# Patient Record
Sex: Male | Born: 1970
Health system: Southern US, Community
[De-identification: ages and names within clinical notes are randomized; demographics above are authoritative.]

---

## 2009-02-21 ENCOUNTER — Emergency Department (HOSPITAL_COMMUNITY): Admission: EM | Admit: 2009-02-21 | Discharge: 2009-02-22 | Payer: Self-pay | Admitting: Emergency Medicine

## 2013-01-07 ENCOUNTER — Emergency Department (HOSPITAL_BASED_OUTPATIENT_CLINIC_OR_DEPARTMENT_OTHER)
Admission: EM | Admit: 2013-01-07 | Discharge: 2013-01-08 | Disposition: A | Discharge status: Home / Self Care | Payer: Self-pay | Attending: Emergency Medicine | Admitting: Emergency Medicine

## 2013-01-07 ENCOUNTER — Encounter (HOSPITAL_BASED_OUTPATIENT_CLINIC_OR_DEPARTMENT_OTHER): Payer: Self-pay | Admitting: Emergency Medicine

## 2013-01-07 DIAGNOSIS — X088XXA Exposure to other specified smoke, fire and flames, initial encounter: Secondary | ICD-10-CM | POA: Insufficient documentation

## 2013-01-07 DIAGNOSIS — T23021A Burn of unspecified degree of single right finger (nail) except thumb, initial encounter: Secondary | ICD-10-CM

## 2013-01-07 DIAGNOSIS — Z87891 Personal history of nicotine dependence: Secondary | ICD-10-CM | POA: Insufficient documentation

## 2013-01-07 DIAGNOSIS — Y93G9 Activity, other involving cooking and grilling: Secondary | ICD-10-CM | POA: Insufficient documentation

## 2013-01-07 DIAGNOSIS — T23129A Burn of first degree of unspecified single finger (nail) except thumb, initial encounter: Secondary | ICD-10-CM | POA: Insufficient documentation

## 2013-01-07 DIAGNOSIS — Y929 Unspecified place or not applicable: Secondary | ICD-10-CM | POA: Insufficient documentation

## 2013-01-07 MED ORDER — CEPHALEXIN 500 MG PO CAPS: 500.0000 mg | ORAL_CAPSULE | Freq: Four times a day (QID) | ORAL | Status: DC

## 2013-01-07 NOTE — ED Notes (Signed)
Pt states that he burnt the tip of his right index finger 2 months ago and area has not healed, pt reports that it continuously bleeds when it touches anything

## 2013-01-07 NOTE — ED Provider Notes (Signed)
CSN: [id number]     Arrival date & time 01/07/13  2227 History   First MD Initiated Contact with Patient 01/07/13 2301     Chief Complaint  Patient presents with  . Wound Check   (Consider location/radiation/quality/duration/timing/severity/associated sxs/prior Treatment) HPI Comments: 42 year old male comes in due to a poorly healing finger. Patient states that 2 months ago he was grilling and accidentally burned his right ring finger. Since then his become black and the tip and it bleeds whenever he touches something. He works as a Paediatric nurse and every time his hand contact something he says he knows the skin was often bleeds. He started noticing focal swelling to his fingertip and cut part of it with scissors last week. Denies any drainage. No fevers or chills. No numbness or tingling. Has no problems using his hand. He has not seen anyone for his finger initially or since the burn. He states that he had a tetanus update 2 years ago.     History reviewed. No pertinent past medical history. History reviewed. No pertinent past surgical history. History reviewed. No pertinent family history. History  Substance Use Topics  . Smoking status: Former Smoker    Types: Cigarettes    Quit date: 02/07/2012  . Smokeless tobacco: Not on file  . Alcohol Use: No    Review of Systems  Constitutional: Negative for fever and chills.  Musculoskeletal: Negative for joint swelling.  Skin: Positive for color change and wound.    Allergies  Review of patient's allergies indicates no known allergies.  Home Medications  No current outpatient prescriptions on file. BP 127/79  Pulse 66  Temp(Src) 98.5 F (36.9 C) (Oral)  Resp 18  Ht 5\' 7"  (1.702 m)  Wt 155 lb (70.308 kg)  BMI 24.27 kg/m2  SpO2 100% Physical Exam  Nursing note and vitals reviewed. Constitutional: He is oriented to person, place, and time. He appears well-developed and well-nourished. No distress.  HENT:  Head:  Normocephalic.  Eyes: Right eye exhibits no discharge. Left eye exhibits no discharge.  Cardiovascular: Intact distal pulses.   Pulmonary/Chest: Effort normal.  Abdominal: He exhibits no distension.  Musculoskeletal:       Hands: Neurological: He is alert and oriented to person, place, and time.  Skin: Skin is warm and dry.    ED Course  Procedures (including critical care time) Labs Review Labs Reviewed - No data to display Imaging Review No results found.  MDM   1. Burn of finger, right, unspecified degree, initial encounter    There is some mild erythema surrounding a circular black lesion at the tip of his finger is concerning for possible early infection. i'll cover with antibiotics, especially since he has been manipulating it with scissors. Patient feels like he might have a report is requesting a referral to a dermatologist. It is difficult to tell if there is a wart there due to the color of the skin and swelling. Do not feel any appreciable induration or abscess. Referred to the dermatologist and hand surgeon. Tetanus is up-to-date.   Audree Camel, MD 01/08/13 0005

## 2013-05-01 ENCOUNTER — Emergency Department (HOSPITAL_BASED_OUTPATIENT_CLINIC_OR_DEPARTMENT_OTHER)
Admission: EM | Admit: 2013-05-01 | Discharge: 2013-05-01 | Disposition: A | Discharge status: Home / Self Care | Payer: Self-pay | Attending: Emergency Medicine | Admitting: Emergency Medicine

## 2013-05-01 ENCOUNTER — Encounter (HOSPITAL_BASED_OUTPATIENT_CLINIC_OR_DEPARTMENT_OTHER): Payer: Self-pay | Admitting: Emergency Medicine

## 2013-05-01 DIAGNOSIS — R000 Tachycardia, unspecified: Secondary | ICD-10-CM | POA: Insufficient documentation

## 2013-05-01 DIAGNOSIS — X503XXA Overexertion from repetitive movements, initial encounter: Secondary | ICD-10-CM | POA: Insufficient documentation

## 2013-05-01 DIAGNOSIS — S39012A Strain of muscle, fascia and tendon of lower back, initial encounter: Secondary | ICD-10-CM

## 2013-05-01 DIAGNOSIS — Y990 Civilian activity done for income or pay: Secondary | ICD-10-CM | POA: Insufficient documentation

## 2013-05-01 DIAGNOSIS — J111 Influenza due to unidentified influenza virus with other respiratory manifestations: Secondary | ICD-10-CM | POA: Insufficient documentation

## 2013-05-01 DIAGNOSIS — R109 Unspecified abdominal pain: Secondary | ICD-10-CM | POA: Insufficient documentation

## 2013-05-01 DIAGNOSIS — Z87891 Personal history of nicotine dependence: Secondary | ICD-10-CM | POA: Insufficient documentation

## 2013-05-01 DIAGNOSIS — Z792 Long term (current) use of antibiotics: Secondary | ICD-10-CM | POA: Insufficient documentation

## 2013-05-01 DIAGNOSIS — Y9389 Activity, other specified: Secondary | ICD-10-CM | POA: Insufficient documentation

## 2013-05-01 DIAGNOSIS — R5381 Other malaise: Secondary | ICD-10-CM | POA: Insufficient documentation

## 2013-05-01 DIAGNOSIS — S335XXA Sprain of ligaments of lumbar spine, initial encounter: Secondary | ICD-10-CM | POA: Insufficient documentation

## 2013-05-01 DIAGNOSIS — R51 Headache: Secondary | ICD-10-CM | POA: Insufficient documentation

## 2013-05-01 DIAGNOSIS — Y9289 Other specified places as the place of occurrence of the external cause: Secondary | ICD-10-CM | POA: Insufficient documentation

## 2013-05-01 LAB — URINALYSIS, ROUTINE W REFLEX MICROSCOPIC
Bilirubin Urine: NEGATIVE
Glucose, UA: NEGATIVE mg/dL
Ketones, ur: NEGATIVE mg/dL
Nitrite: NEGATIVE
Protein, ur: 30 mg/dL — AB
Specific Gravity, Urine: 1.029 (ref 1.005–1.030)
Urobilinogen, UA: 1 mg/dL (ref 0.0–1.0)

## 2013-05-01 LAB — URINE MICROSCOPIC-ADD ON

## 2013-05-01 MED ORDER — IBUPROFEN 800 MG PO TABS
800.0000 mg | ORAL_TABLET | Freq: Once | ORAL | Status: AC
  Administered 2013-05-01: 800 mg via ORAL
  Filled 2013-05-01: qty 1

## 2013-05-01 MED ORDER — HYDROCODONE-ACETAMINOPHEN 5-325 MG PO TABS
2.0000 | ORAL_TABLET | Freq: Once | ORAL | Status: AC
  Administered 2013-05-01: 2 via ORAL
  Filled 2013-05-01: qty 2

## 2013-05-01 MED ORDER — HYDROCODONE-ACETAMINOPHEN 5-325 MG PO TABS: 1.0000 | ORAL_TABLET | Freq: Four times a day (QID) | ORAL | Status: DC | PRN

## 2013-05-01 MED ORDER — SODIUM CHLORIDE 0.9 % IV BOLUS (SEPSIS)
1000.0000 mL | Freq: Once | INTRAVENOUS | Status: AC
  Administered 2013-05-01: 1000 mL via INTRAVENOUS

## 2013-05-01 NOTE — ED Provider Notes (Signed)
CSN: [id number]     Arrival date & time 05/01/13  1717 History   First MD Initiated Contact with Patient 05/01/13 1842     Chief Complaint  Patient presents with  . Fever   (Consider location/radiation/quality/duration/timing/severity/associated sxs/prior Treatment) The history is provided by the patient and medical records. No language interpreter was used.    Dylan Edwards is a 42 y.o. male  with no known medical problems presents to the Emergency Department complaining of gradual, persistent, progressively worsening fever and chills with associated generalized headache and low back pain onset lat night.  Pt's daughter was dx with RSV yesterday with high fevers. He reports caring for his daughter while she was ill.  He has taken Tylenol without fever relief. Associated symptoms include sore throat, low back pain.  Nothing makes it better and nothing  makes it worse.  Pt denies  Neck pain, neck stiffness, chest pain, SOB, abd pain, N/V/D, weakness, dizziness, syncope.     Patient also reports that he lifts heavy objects for work in his low back pain has been present for 3 weeks.  This has been accompanied by left groin pain.  he does not recall a specific incident in which he strained his back.  He denies saddle anesthesia, gait disturbance, leg weakness, or bowel/bladder incontinence.  He denies history of HIV, IV drug use, known trauma or anticoagulants.    History reviewed. No pertinent past medical history. History reviewed. No pertinent past surgical history. History reviewed. No pertinent family history. History  Substance Use Topics  . Smoking status: Former Smoker    Types: Cigarettes    Quit date: 02/07/2012  . Smokeless tobacco: Not on file  . Alcohol Use: No    Review of Systems  Constitutional: Positive for fever, chills and fatigue. Negative for appetite change.  HENT: Positive for congestion, postnasal drip, rhinorrhea, sinus pressure and sore throat. Negative for  ear discharge, ear pain and mouth sores.   Eyes: Negative for visual disturbance.  Respiratory: Positive for cough. Negative for chest tightness, shortness of breath, wheezing and stridor.   Cardiovascular: Negative for chest pain, palpitations and leg swelling.  Gastrointestinal: Negative for nausea, vomiting, abdominal pain and diarrhea.  Genitourinary: Negative for dysuria, urgency, frequency and hematuria.       Left groin pain  Musculoskeletal: Positive for back pain. Negative for arthralgias, gait problem, joint swelling, myalgias, neck pain and neck stiffness.  Skin: Negative for rash.  Neurological: Positive for headaches. Negative for syncope, weakness, light-headedness and numbness.  Hematological: Negative for adenopathy.  Psychiatric/Behavioral: The patient is not nervous/anxious.   All other systems reviewed and are negative.    Allergies  Review of patient's allergies indicates no known allergies.  Home Medications   Current Outpatient Rx  Name  Route  Sig  Dispense  Refill  . cephALEXin (KEFLEX) 500 MG capsule   Oral   Take 1 capsule (500 mg total) by mouth 4 (four) times daily.   28 capsule   0   . HYDROcodone-acetaminophen (NORCO/VICODIN) 5-325 MG per tablet   Oral   Take 1-2 tablets by mouth every 6 (six) hours as needed for severe pain.   6 tablet   0    BP 104/60  Pulse 93  Temp(Src) 99 F (37.2 C) (Oral)  Resp 18  Ht 5\' 7"  (1.702 m)  Wt 168 lb (76.204 kg)  BMI 26.31 kg/m2  SpO2 97% Physical Exam  Nursing note and vitals reviewed. Constitutional: He is oriented to  person, place, and time. He appears well-developed and well-nourished. No distress.  Awake, alert, nontoxic appearance  HENT:  Head: Normocephalic and atraumatic.  Right Ear: Tympanic membrane, external ear and ear canal normal.  Left Ear: Tympanic membrane, external ear and ear canal normal.  Nose: Mucosal edema and rhinorrhea present. No epistaxis. Right sinus exhibits no maxillary  sinus tenderness and no frontal sinus tenderness. Left sinus exhibits no maxillary sinus tenderness and no frontal sinus tenderness.  Mouth/Throat: Uvula is midline, oropharynx is clear and moist and mucous membranes are normal. Mucous membranes are not pale and not cyanotic. No oropharyngeal exudate, posterior oropharyngeal edema, posterior oropharyngeal erythema or tonsillar abscesses.  Nasal congestion  Eyes: Conjunctivae are normal. Pupils are equal, round, and reactive to light. No scleral icterus.  Neck: Normal range of motion and full passive range of motion without pain. Neck supple.  Full ROM without pain  Cardiovascular: Regular rhythm, normal heart sounds and intact distal pulses.   No murmur heard. Tachycardia  Pulmonary/Chest: Effort normal and breath sounds normal. No stridor. No respiratory distress. He has no wheezes.  Clear and equal breath sounds  Abdominal: Soft. Bowel sounds are normal. He exhibits no distension and no mass. There is no tenderness. There is no rebound and no guarding. Hernia confirmed negative in the right inguinal area and confirmed negative in the left inguinal area.  Abdomen soft and nontender  Genitourinary: Testes normal and penis normal.    Cremasteric reflex is present. Right testis shows no mass, no swelling and no tenderness. Right testis is descended. Cremasteric reflex is not absent on the right side. Left testis shows no mass, no swelling and no tenderness. Left testis is descended. Cremasteric reflex is not absent on the left side. Circumcised. No phimosis, paraphimosis, hypospadias, penile erythema or penile tenderness. No discharge found.     Patient with tenderness to palpation of the left groin No erythema or induration No tenderness to palpation of the testicles No inguinal or femoral hernia No palpable inguinal adenopathy  Musculoskeletal: Normal range of motion. He exhibits no edema.  Full range of motion of the T-spine and  L-spine No tenderness to palpation of the spinous processes of the T-spine or L-spine Mild tenderness to palpation of the paraspinous muscles of the L-spine  Lymphadenopathy:    He has no cervical adenopathy.       Right: No inguinal adenopathy present.       Left: No inguinal adenopathy present.  Neurological: He is alert and oriented to person, place, and time. He has normal reflexes.  Speech is clear and goal oriented, follows commands Normal strength in upper and lower extremities bilaterally including dorsiflexion and plantar flexion, strong and equal grip strength Sensation normal to light and sharp touch Moves extremities without ataxia, coordination intact Normal gait Normal balance   Skin: Skin is warm and dry. No rash noted. He is not diaphoretic. No erythema.  Psychiatric: He has a normal mood and affect.    ED Course  Procedures (including critical care time) Labs Review Labs Reviewed  URINALYSIS, ROUTINE W REFLEX MICROSCOPIC - Abnormal; Notable for the following:    Protein, ur 30 (*)    All other components within normal limits  URINE MICROSCOPIC-ADD ON   Imaging Review No results found.  EKG Interpretation   None       MDM   1. Influenza-like illness   2. Lumbar strain, initial encounter      Dylan Edwards presents with fever, chills, headache  ans exposure to children with the same.  Pt also with low back and left groin pain.  No evidence of hernia or other penile/testicular pathology.  No pain to palpation of the perineal area, no erythema or induration.  No pain to palpation of the testes, scrotum or penis; doubt abscess, cellulitis or Fournier's gangrene.  Patient with symptoms consistent with influenza-like illness.  Vitals are stable, fever on arrival now resolved.  No signs of dehydration, tolerating PO's.  Lungs are clear. Due to patient's presentation and physical exam a chest x-ray was not ordered bc likely diagnosis of ILI vs viral URI. Patient  will be discharged with instructions to orally hydrate, rest, and use over-the-counter medications such as anti-inflammatories ibuprofen and Aleve for muscle aches and Tylenol for fever. Pt given fluid bolus here and treated for pain.  Improvement of back pain and complete resolution of headache.  Pt to be d/c with symptomatic therapy and pain control for his back.    It has been determined that no acute conditions requiring further emergency intervention are present at this time. The patient/guardian have been advised of the diagnosis and plan. We have discussed signs and symptoms that warrant return to the ED, such as changes or worsening in symptoms.   Vital signs are stable at discharge.   BP 104/60  Pulse 93  Temp(Src) 99 F (37.2 C) (Oral)  Resp 18  Ht 5\' 7"  (1.702 m)  Wt 168 lb (76.204 kg)  BMI 26.31 kg/m2  SpO2 97%  Patient/guardian has voiced understanding and agreed to follow-up with the PCP or specialist.        Dierdre Forth, PA-C 05/01/13 2109

## 2013-05-01 NOTE — ED Notes (Addendum)
Pt sts he has fever, cough, headache, severe lower back pain. Confirms nausea but  Denies V/D. Sts she took his child who had RSV, other child in home is also now sick, was seen yesterday had resp tx's and sent home.

## 2013-05-05 NOTE — ED Provider Notes (Signed)
Medical screening examination/treatment/procedure(s) were performed by non-physician practitioner and as supervising physician I was immediately available for consultation/collaboration.  EKG Interpretation   None         Shelda Jakes, MD 05/05/13 [phone]

## 2015-01-05 ENCOUNTER — Ambulatory Visit (INDEPENDENT_AMBULATORY_CARE_PROVIDER_SITE_OTHER): Payer: 59 | Admitting: Family Medicine

## 2015-01-05 VITALS — BP 140/80 | HR 89 | Temp 98.5°F | Resp 16 | Ht 67.5 in | Wt 184.0 lb

## 2015-01-05 DIAGNOSIS — G44229 Chronic tension-type headache, not intractable: Secondary | ICD-10-CM

## 2015-01-05 DIAGNOSIS — M25579 Pain in unspecified ankle and joints of unspecified foot: Secondary | ICD-10-CM | POA: Diagnosis not present

## 2015-01-05 DIAGNOSIS — R739 Hyperglycemia, unspecified: Secondary | ICD-10-CM

## 2015-01-05 DIAGNOSIS — R42 Dizziness and giddiness: Secondary | ICD-10-CM | POA: Diagnosis not present

## 2015-01-05 LAB — POCT CBC
Granulocyte percent: 38.9 %G (ref 37–80)
HCT, POC: 45.6 % (ref 43.5–53.7)
Hemoglobin: 14.9 g/dL (ref 14.1–18.1)
Lymph, poc: 2.5 (ref 0.6–3.4)
MCH, POC: 28.7 pg (ref 27–31.2)
MCHC: 32.7 g/dL (ref 31.8–35.4)
MCV: 87.7 fL (ref 80–97)
MID (cbc): 0.3 (ref 0–0.9)
MPV: 6.8 fL (ref 0–99.8)
POC Granulocyte: 1.8 — AB (ref 2–6.9)
POC LYMPH PERCENT: 54.9 %L — AB (ref 10–50)
POC MID %: 6.2 %M (ref 0–12)
Platelet Count, POC: 343 10*3/uL (ref 142–424)
RBC: 5.19 M/uL (ref 4.69–6.13)
RDW, POC: 12.4 %
WBC: 4.5 10*3/uL — AB (ref 4.6–10.2)

## 2015-01-05 LAB — COMPLETE METABOLIC PANEL WITH GFR
ALT: 31 U/L (ref 9–46)
AST: 23 U/L (ref 10–40)
Albumin: 4.6 g/dL (ref 3.6–5.1)
Alkaline Phosphatase: 61 U/L (ref 40–115)
BUN: 13 mg/dL (ref 7–25)
CO2: 29 mmol/L (ref 20–31)
Calcium: 9.6 mg/dL (ref 8.6–10.3)
Chloride: 101 mmol/L (ref 98–110)
Creat: 0.85 mg/dL (ref 0.60–1.35)
GFR, Est African American: >89 mL/min (ref >=60)
GFR, Est Non African American: >89 mL/min (ref >=60)
Glucose, Bld: 96 mg/dL (ref 65–99)
Potassium: 4.2 mmol/L (ref 3.5–5.3)
Sodium: 138 mmol/L (ref 135–146)
Total Bilirubin: 1.1 mg/dL (ref 0.2–1.2)
Total Protein: 7.2 g/dL (ref 6.1–8.1)

## 2015-01-05 LAB — LIPID PANEL
Cholesterol: 216 mg/dL — ABNORMAL HIGH (ref 125–200)
HDL: 38 mg/dL — ABNORMAL LOW (ref >=40)
LDL Cholesterol: 153 mg/dL — ABNORMAL HIGH (ref <130)
Total CHOL/HDL Ratio: 5.7 Ratio — ABNORMAL HIGH (ref <=5.0)
Triglycerides: 123 mg/dL (ref <150)
VLDL: 25 mg/dL (ref <30)

## 2015-01-05 LAB — POCT GLYCOSYLATED HEMOGLOBIN (HGB A1C): Hemoglobin A1C: 5.7

## 2015-01-05 LAB — GLUCOSE, POCT (MANUAL RESULT ENTRY): POC Glucose: 84 mg/dl (ref 70–99)

## 2015-01-05 NOTE — Progress Notes (Signed)
Subjective:  This chart was scribed for Dylan Sidle MD,  by Dylan Edwards, at [facility].  This patient was seen in room 4 and the patient's care was started at 11:43 AM.    Patient ID: Dylan Edwards, male    DOB: 26-Feb-1971, 44 y.o.   MRN: [id number] Chief Complaint  Patient presents with  . Sugars running high  . Dizziness    5 days ago, chills, stopped car, vomited    HPI  HPI Comments: Dylan Edwards is a 44 y.o. male with a history of diabetes who presents to the [facility] complaining of worsening dizziness/ blurry vision, vomiting and chills which started 5 days ago but states it was the worst this past Saturday (four days ago) -- his symptoms have been going on for the past 6 months.  He has been checking his sugar and states that it has been ranging from 120-169.  He states that his feet hurt often after standing for short periods of time and feels fatigued very quickly.   Patients mother died from diabetes and states that his brother and sister also have diabetes.  He also complains of a headache which has had "for a while now".  Denies any difficulty with urination or frequency.    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  He works over 70 hours a week.      There are no active problems to display for this patient.  History reviewed. No pertinent past medical history. History reviewed. No pertinent past surgical history. No Known Allergies Prior to Admission medications   Medication Sig Start Date End Date Taking? Authorizing Provider  aspirin-acetaminophen-caffeine (EXCEDRIN MIGRAINE) [phone] MG per tablet Take by mouth every 6 (six) hours as needed for headache.   Yes Historical Provider, MD   Social History   Social History  . Marital Status: Unknown    Spouse Name: N/A  . Number of Children: N/A  . Years of Education: N/A   Occupational History  . Not on file.   Social  History Main Topics  . Smoking status: Former Smoker    Types: Cigarettes    Quit date: 02/07/2012  . Smokeless tobacco: Current User  . Alcohol Use: No  . Drug Use: No  . Sexual Activity: Not on file   Other Topics Concern  . Not on file   Social History Narrative        Review of Systems  Constitutional: Positive for chills and fatigue. Negative for fever.  Eyes: Positive for visual disturbance. Negative for discharge.  Respiratory: Negative for cough and shortness of breath.   Cardiovascular: Negative for chest pain.  Gastrointestinal: Positive for vomiting.  Genitourinary: Negative for urgency and frequency.  Musculoskeletal: Positive for myalgias. Negative for neck pain and neck stiffness.  Neurological: Positive for dizziness. Negative for seizures and speech difficulty.       Objective:   Physical Exam  Constitutional: He is oriented to person, place, and time. He appears well-developed. No distress.  HENT:  Head: Normocephalic and atraumatic.  Eyes: Pupils are equal, round, and reactive to light.  Cardiovascular: Normal rate.   Pulmonary/Chest: Effort normal. No respiratory distress.  Musculoskeletal: Normal range of motion.  Neurological: He is alert and oriented to person, place, and time.  Skin: Skin is warm and dry.  Psychiatric: He has a normal mood and affect. His behavior is normal.  Nursing note and vitals reviewed.  Filed Vitals:   01/05/15 1116  BP: 140/80  Pulse: 89  Temp: 98.5 F (36.9 C)  TempSrc: Oral  Resp: 16  Height: 5' 7.5" (1.715 m)  Weight: 184 lb (83.462 kg)  SpO2: 99%   Results for orders placed or performed in visit on 01/05/15  POCT glucose (manual entry)  Result Value Ref Range   POC Glucose 84 70 - 99 mg/dl  POCT glycosylated hemoglobin (Hb A1C)  Result Value Ref Range   Hemoglobin A1C 5.7   POCT CBC  Result Value Ref Range   WBC 4.5 (A) 4.6 - 10.2 K/uL   Lymph, poc 2.5 0.6 - 3.4   POC LYMPH PERCENT 54.9 (A) 10 -  50 %L   MID (cbc) 0.3 0 - 0.9   POC MID % 6.2 0 - 12 %M   POC Granulocyte 1.8 (A) 2 - 6.9   Granulocyte percent 38.9 37 - 80 %G   RBC 5.19 4.69 - 6.13 M/uL   Hemoglobin 14.9 14.1 - 18.1 g/dL   HCT, POC 45.4 09.8 - 53.7 %   MCV 87.7 80 - 97 fL   MCH, POC 28.7 27 - 31.2 pg   MCHC 32.7 31.8 - 35.4 g/dL   RDW, POC 11.9 %   Platelet Count, POC 343 142 - 424 K/uL   MPV 6.8 0 - 99.8 fL       Assessment & Plan:   Patient is having some random elevations of his blood sugar at home but has test done here suggested he does not have diabetes. He may be having some of the symptoms because he's working so hard during the summer months and getting some dehydration affects.

## 2015-01-05 NOTE — Patient Instructions (Signed)
Tests show that you do not have diabetes. You may have a prediabetic condition that is made worse by working so many hours and possibly getting dehydrated in the summer heat. This point, no treatment is necessary. The emphasis should be on regular activity and eating fresh vegetables, fruits, nuts and avoiding fast foods. Also, getting regular sleep pattern established is very important.

## 2015-01-07 ENCOUNTER — Encounter: Payer: Self-pay | Admitting: Family Medicine

## 2016-02-01 DIAGNOSIS — K1321 Leukoplakia of oral mucosa, including tongue: Secondary | ICD-10-CM | POA: Insufficient documentation

## 2016-06-21 ENCOUNTER — Ambulatory Visit (INDEPENDENT_AMBULATORY_CARE_PROVIDER_SITE_OTHER): Payer: 59

## 2016-06-21 ENCOUNTER — Ambulatory Visit (INDEPENDENT_AMBULATORY_CARE_PROVIDER_SITE_OTHER): Payer: 59 | Admitting: Emergency Medicine

## 2016-06-21 VITALS — BP 126/80 | HR 79 | Temp 98.2°F | Resp 16 | Ht 68.0 in | Wt 199.0 lb

## 2016-06-21 DIAGNOSIS — R1013 Epigastric pain: Secondary | ICD-10-CM

## 2016-06-21 MED ORDER — OMEPRAZOLE 40 MG PO CPDR: 40.0000 mg | DELAYED_RELEASE_CAPSULE | Freq: Every day | ORAL | 3 refills | Status: DC

## 2016-06-21 MED ORDER — RANITIDINE HCL 300 MG PO TABS: 300.0000 mg | ORAL_TABLET | Freq: Every day | ORAL | 1 refills | Status: DC

## 2016-06-21 NOTE — Progress Notes (Signed)
[employer] 46 y.o.   Chief Complaint  Patient presents with  . Abdominal Pain    x 6 days, Pt. says feels like something in chest, pain when eating     HISTORY OF PRESENT ILLNESS: This is a 46 y.o. male complaining of upper abdominal pain x the past week.  Abdominal Pain  This is a new problem. The current episode started in the past 7 days. The onset quality is gradual. The problem occurs intermittently. The problem has been waxing and waning. The pain is located in the epigastric region. The pain is mild. The quality of the pain is colicky and cramping. The abdominal pain does not radiate. Associated symptoms include nausea. Pertinent negatives include no anorexia, constipation, diarrhea, dysuria, fever, headaches, hematochezia, hematuria, melena, myalgias, vomiting or weight loss. Nothing aggravates the pain. The pain is relieved by nothing. He has tried antacids for the symptoms. The treatment provided no relief. There is no history of abdominal surgery, colon cancer, Crohn's disease, gallstones, irritable bowel syndrome, pancreatitis or PUD.     Prior to Admission medications   Medication Sig Start Date End Date Taking? Authorizing Provider  aspirin-acetaminophen-caffeine (EXCEDRIN MIGRAINE) [id number] MG per tablet Take by mouth every 6 (six) hours as needed for headache.   Yes Historical Provider, MD  omeprazole (PRILOSEC) 40 MG capsule Take 1 capsule (40 mg total) by mouth daily. 06/21/16   Georgina Quint, MD  ranitidine (ZANTAC) 300 MG tablet Take 1 tablet (300 mg total) by mouth at bedtime. 06/21/16 07/21/16  Georgina Quint, MD    No Known Allergies  There are no active problems to display for this patient.   No past medical history on file.  No past surgical history on file.  Social History   Social History  . Marital status: Unknown    Spouse name: N/A  . Number of children: N/A  . Years of education: N/A   Occupational History  . Not on file.    Social History Main Topics  . Smoking status: Former Smoker    Types: Cigarettes    Quit date: 02/07/2012  . Smokeless tobacco: Current User  . Alcohol use No  . Drug use: No  . Sexual activity: Not on file   Other Topics Concern  . Not on file   Social History Narrative  . No narrative on file    Family History  Problem Relation Age of Onset  . Diabetes Mother   . Diabetes Sister   . Diabetes Brother      Review of Systems  Constitutional: Negative for fever and weight loss.  HENT: Negative.   Eyes: Negative for discharge and redness.  Respiratory: Negative for cough, shortness of breath and wheezing.   Cardiovascular: Positive for chest pain. Negative for palpitations.  Gastrointestinal: Positive for abdominal pain and nausea. Negative for anorexia, constipation, diarrhea, hematochezia, melena and vomiting.  Genitourinary: Negative for dysuria and hematuria.  Musculoskeletal: Negative for myalgias and neck pain.  Skin: Negative for rash.  Neurological: Negative for dizziness and headaches.  Endo/Heme/Allergies: Does not bruise/bleed easily.  All other systems reviewed and are negative.  Vitals:   06/21/16 1447  BP: 126/80  Pulse: 79  Resp: 16  Temp: 98.2 F (36.8 C)     Physical Exam  Constitutional: He is oriented to person, place, and time. He appears well-developed and well-nourished.  HENT:  Head: Normocephalic and atraumatic.  Nose: Nose normal.  Mouth/Throat: Oropharynx is clear and moist.  Eyes: Conjunctivae and  EOM are normal. Pupils are equal, round, and reactive to light.  Neck: Normal range of motion. Neck supple. No JVD present. No thyromegaly present.  Cardiovascular: Normal rate, regular rhythm and normal heart sounds.   Pulmonary/Chest: Effort normal and breath sounds normal.  Abdominal: Soft. There is tenderness (epigastric). There is no rebound.  Musculoskeletal: Normal range of motion.  Lymphadenopathy:    He has no cervical  adenopathy.  Neurological: He is alert and oriented to person, place, and time. No sensory deficit. He exhibits normal muscle tone.  Skin: Skin is warm and dry. Capillary refill takes less than 2 seconds.  Psychiatric: He has a normal mood and affect. His behavior is normal.  Vitals reviewed.  EKG: NSR, no acute ischemic changes. CXR/AAS reviewed: NAD  ASSESSMENT & PLAN: Dylan Edwards was seen today for abdominal pain.  Diagnoses and all orders for this visit:  Abdominal pain, epigastric -     EKG 12-Lead -     CBC with Differential/Platelet -     Comprehensive metabolic panel -     DG Abd Acute W/Chest; Future -     Lipase -     Amylase -     Ambulatory referral to Gastroenterology -     US Abdomen Complete; Future  Other orders -     omeprazole (PRILOSEC) 40 MG capsule; Take 1 capsule (40 mg total) by mouth daily. -     ranitidine (ZANTAC) 300 MG tablet; Take 1 tablet (300 mg total) by mouth at bedtime.    Patient Instructions       IF you received an x-ray today, you will receive an invoice from [facility]. Please contact [facility] at [phone] with questions or concerns regarding your invoice.   IF you received labwork today, you will receive an invoice from [facility]. Please contact [facility] at [phone] with questions or concerns regarding your invoice.   Our billing staff will not be able to assist you with questions regarding bills from these companies.  You will be contacted with the lab results as soon as they are available. The fastest way to get your results is to activate your My Chart account. Instructions are located on the last page of this paperwork. If you have not heard from us regarding the results in 2 weeks, please contact this office.      Gastritis, Adult Gastritis is soreness and puffiness (inflammation) of the lining of the stomach. If you do not get help, gastritis can cause bleeding and sores (ulcers) in the  stomach. HOME CARE   Only take medicine as told by your doctor.  If you were given antibiotic medicines, take them as told. Finish the medicines even if you start to feel better.  Drink enough fluids to keep your pee (urine) clear or pale yellow.  Avoid foods and drinks that make your problems worse. Foods you may want to avoid include:  Caffeine or alcohol.  Chocolate.  Mint.  Garlic and onions.  Spicy foods.  Citrus fruits, including oranges, lemons, or limes.  Food containing tomatoes, including sauce, chili, salsa, and pizza.  Fried and fatty foods.  Eat small meals throughout the day instead of large meals. GET HELP RIGHT AWAY IF:   You have black or dark red poop (stools).  You throw up (vomit) blood. It may look like coffee grounds.  You cannot keep fluids down.  Your belly (abdominal) pain gets worse.  You have a fever.  You do not feel better after 1  week.  You have any other questions or concerns. MAKE SURE YOU:   Understand these instructions.  Will watch your condition.  Will get help right away if you are not doing well or get worse. This information is not intended to replace advice given to you by your health care provider. Make sure you discuss any questions you have with your health care provider. Document Released: 10/11/2007 Document Revised: 07/17/2011 Document Reviewed: 01/16/2015 Elsevier Interactive Patient Education  2017 Elsevier Inc.      Edwina Barth, MD [facility]

## 2016-06-21 NOTE — Patient Instructions (Addendum)
     IF you received an x-ray today, you will receive an invoice from [facility]. Please contact [facility] at [phone] with questions or concerns regarding your invoice.   IF you received labwork today, you will receive an invoice from [facility]. Please contact [facility] at [phone] with questions or concerns regarding your invoice.   Our billing staff will not be able to assist you with questions regarding bills from these companies.  You will be contacted with the lab results as soon as they are available. The fastest way to get your results is to activate your My Chart account. Instructions are located on the last page of this paperwork. If you have not heard from us regarding the results in 2 weeks, please contact this office.      Gastritis, Adult Gastritis is soreness and puffiness (inflammation) of the lining of the stomach. If you do not get help, gastritis can cause bleeding and sores (ulcers) in the stomach. HOME CARE   Only take medicine as told by your doctor.  If you were given antibiotic medicines, take them as told. Finish the medicines even if you start to feel better.  Drink enough fluids to keep your pee (urine) clear or pale yellow.  Avoid foods and drinks that make your problems worse. Foods you may want to avoid include:  Caffeine or alcohol.  Chocolate.  Mint.  Garlic and onions.  Spicy foods.  Citrus fruits, including oranges, lemons, or limes.  Food containing tomatoes, including sauce, chili, salsa, and pizza.  Fried and fatty foods.  Eat small meals throughout the day instead of large meals. GET HELP RIGHT AWAY IF:   You have black or dark red poop (stools).  You throw up (vomit) blood. It may look like coffee grounds.  You cannot keep fluids down.  Your belly (abdominal) pain gets worse.  You have a fever.  You do not feel better after 1 week.  You have any other questions or concerns. MAKE SURE YOU:    Understand these instructions.  Will watch your condition.  Will get help right away if you are not doing well or get worse. This information is not intended to replace advice given to you by your health care provider. Make sure you discuss any questions you have with your health care provider. Document Released: 10/11/2007 Document Revised: 07/17/2011 Document Reviewed: 01/16/2015 Elsevier Interactive Patient Education  2017 [employer]

## 2016-06-22 ENCOUNTER — Encounter: Payer: Self-pay | Admitting: Emergency Medicine

## 2016-06-22 LAB — COMPREHENSIVE METABOLIC PANEL
ALBUMIN: 4.7 g/dL (ref 3.5–5.5)
ALT: 48 IU/L — AB (ref 0–44)
AST: 27 IU/L (ref 0–40)
Albumin/Globulin Ratio: 1.9 (ref 1.2–2.2)
Alkaline Phosphatase: 65 IU/L (ref 39–117)
BILIRUBIN TOTAL: 0.6 mg/dL (ref 0.0–1.2)
BUN/Creatinine Ratio: 13 (ref 9–20)
BUN: 11 mg/dL (ref 6–24)
CHLORIDE: 97 mmol/L (ref 96–106)
CO2: 26 mmol/L (ref 18–29)
CREATININE: 0.85 mg/dL (ref 0.76–1.27)
Calcium: 9.7 mg/dL (ref 8.7–10.2)
GFR calc non Af Amer: 107 mL/min/{1.73_m2} (ref >59)
GFR, EST AFRICAN AMERICAN: 123 mL/min/{1.73_m2} (ref >59)
GLUCOSE: 117 mg/dL — AB (ref 65–99)
Globulin, Total: 2.5 g/dL (ref 1.5–4.5)
Potassium: 4.3 mmol/L (ref 3.5–5.2)
Sodium: 140 mmol/L (ref 134–144)
TOTAL PROTEIN: 7.2 g/dL (ref 6.0–8.5)

## 2016-06-22 LAB — CBC WITH DIFFERENTIAL/PLATELET
BASOS ABS: 0.1 10*3/uL (ref 0.0–0.2)
Basos: 1 %
EOS (ABSOLUTE): 0.1 10*3/uL (ref 0.0–0.4)
Eos: 1 %
HEMOGLOBIN: 14.5 g/dL (ref 13.0–17.7)
Hematocrit: 41.4 % (ref 37.5–51.0)
IMMATURE GRANS (ABS): 0 10*3/uL (ref 0.0–0.1)
IMMATURE GRANULOCYTES: 1 %
LYMPHS: 46 %
Lymphocytes Absolute: 3 10*3/uL (ref 0.7–3.1)
MCH: 29.4 pg (ref 26.6–33.0)
MCHC: 35 g/dL (ref 31.5–35.7)
MCV: 84 fL (ref 79–97)
MONOCYTES: 8 %
Monocytes Absolute: 0.5 10*3/uL (ref 0.1–0.9)
NEUTROS ABS: 2.9 10*3/uL (ref 1.4–7.0)
NEUTROS PCT: 43 %
PLATELETS: 333 10*3/uL (ref 150–379)
RBC: 4.94 x10E6/uL (ref 4.14–5.80)
RDW: 13.1 % (ref 12.3–15.4)
WBC: 6.6 10*3/uL (ref 3.4–10.8)

## 2016-06-22 LAB — LIPASE: LIPASE: 55 U/L (ref 13–78)

## 2016-06-22 LAB — AMYLASE: Amylase: 86 U/L (ref 31–124)

## 2016-08-22 ENCOUNTER — Encounter: Payer: Self-pay | Admitting: Emergency Medicine

## 2016-08-31 ENCOUNTER — Other Ambulatory Visit: Payer: Self-pay | Admitting: Emergency Medicine

## 2016-09-01 ENCOUNTER — Other Ambulatory Visit: Payer: Self-pay

## 2016-09-01 MED ORDER — OMEPRAZOLE 40 MG PO CPDR: 40.0000 mg | DELAYED_RELEASE_CAPSULE | Freq: Every day | ORAL | 0 refills | Status: DC

## 2016-09-01 NOTE — Progress Notes (Unsigned)
Fax req [facility] [address]  Omeprazole  Ins requires 90 day supply sent

## 2016-10-05 ENCOUNTER — Other Ambulatory Visit: Payer: Self-pay | Admitting: Emergency Medicine

## 2016-12-03 ENCOUNTER — Other Ambulatory Visit: Payer: Self-pay | Admitting: Emergency Medicine

## 2016-12-18 ENCOUNTER — Ambulatory Visit (INDEPENDENT_AMBULATORY_CARE_PROVIDER_SITE_OTHER): Payer: 59 | Admitting: Physician Assistant

## 2016-12-18 ENCOUNTER — Encounter: Payer: Self-pay | Admitting: Physician Assistant

## 2016-12-18 VITALS — BP 134/82 | HR 95 | Temp 99.1°F | Resp 16 | Ht 67.5 in | Wt 187.0 lb

## 2016-12-18 DIAGNOSIS — Q383 Other congenital malformations of tongue: Secondary | ICD-10-CM | POA: Diagnosis not present

## 2016-12-18 DIAGNOSIS — L270 Generalized skin eruption due to drugs and medicaments taken internally: Secondary | ICD-10-CM

## 2016-12-18 LAB — POCT SKIN KOH: Skin KOH, POC: NEGATIVE

## 2016-12-18 MED ORDER — CLOBETASOL PROPIONATE 0.05 % EX OINT: 1.0000 "application " | TOPICAL_OINTMENT | Freq: Two times a day (BID) | CUTANEOUS | 0 refills | Status: DC

## 2016-12-18 MED ORDER — PREDNISONE 20 MG PO TABS: ORAL_TABLET | ORAL | 0 refills | Status: DC

## 2016-12-18 NOTE — Progress Notes (Signed)
12/20/2016 11:17 AM   DOB: 07/17/1970 / MRN: [id number]  SUBJECTIVE:  Dylan Edwards is a 46 y.o. male presenting for rash that started after taking diflucan about a week ago for a tongue issue which started last September. Reports that he began having a rash on his neck about three days after starting the new medication. Feels the neck rash is worsening.  Is in the process of getting a tongue biopsy via a specialty dentist to rule out leukoplakia.  He is a chronic smoker.  Would like labs drawn today to narrow the differential. Denies bloodly stools and chronic abdominal pain.   He has No Known Allergies.   He  has no past medical history on file.    He  reports that he quit smoking about 4 years ago. His smoking use included Cigarettes. He uses smokeless tobacco. He reports that he does not drink alcohol or use drugs. He  has no sexual activity history on file. The patient  has no past surgical history on file.  His family history includes Diabetes in his brother, mother, and sister.  Review of Systems  Constitutional: Negative for chills and fever.  Musculoskeletal: Negative for myalgias.  Skin: Positive for rash. Negative for itching.  Neurological: Negative for dizziness.  Psychiatric/Behavioral: Negative for depression.    The problem list and medications were reviewed and updated by myself where necessary and exist elsewhere in the encounter.   OBJECTIVE:  BP 134/82 (BP Location: Right Arm, Patient Position: Sitting, Cuff Size: Large)   Pulse 95   Temp 99.1 F (37.3 C) (Oral)   Resp 16   Ht 5' 7.5" (1.715 m)   Wt 187 lb (84.8 kg)   SpO2 99%   BMI 28.86 kg/m   Physical Exam  Constitutional: He appears well-developed. He is active and cooperative.  Non-toxic appearance.  Cardiovascular: Normal rate.   Pulmonary/Chest: Effort normal. No tachypnea.  Lymphadenopathy:       Head (right side): No submental, no submandibular, no tonsillar and no preauricular adenopathy  present.       Head (left side): No submental, no submandibular, no tonsillar and no preauricular adenopathy present.    He has no cervical adenopathy.  Neurological: He is alert.  Skin: Skin is warm and dry. He is not diaphoretic. No pallor.  Vitals reviewed.       Results for orders placed or performed in visit on 12/18/16 (from the past 72 hour(s))  TSH     Status: None   Collection Time: 12/18/16  2:03 PM  Result Value Ref Range   TSH 1.360 0.450 - 4.500 uIU/mL  Vitamin B12     Status: None   Collection Time: 12/18/16  2:03 PM  Result Value Ref Range   Vitamin B-12 292 232 - 1,245 pg/mL  Folate     Status: None   Collection Time: 12/18/16  2:03 PM  Result Value Ref Range   Folate 13.7 >3.0 ng/mL    Comment: A serum folate concentration of less than 3.1 ng/mL is considered to represent clinical deficiency.   Sedimentation Rate     Status: None   Collection Time: 12/18/16  2:03 PM  Result Value Ref Range   Sed Rate 7 0 - 15 mm/hr  C-reactive protein     Status: None   Collection Time: 12/18/16  2:03 PM  Result Value Ref Range   CRP 1.3 0.0 - 4.9 mg/L  Hemoglobin A1c     Status: None  Collection Time: 12/18/16  2:03 PM  Result Value Ref Range   Hgb A1c MFr Bld 5.4 4.8 - 5.6 %    Comment:          Prediabetes: 5.7 - 6.4          Diabetes: >6.4          Glycemic control for adults with diabetes: <7.0    Est. average glucose Bld gHb Est-mCnc 108 mg/dL  POCT Skin KOH     Status: None   Collection Time: 12/18/16  3:37 PM  Result Value Ref Range   Skin KOH, POC Negative Negative    No results found.  ASSESSMENT AND PLAN:  Ronte was seen today for rash.  Diagnoses and all orders for this visit:  Tongue abnormality: Will screen other causes and Chron is certainly a possibility, however he has no chronic abdominal pain. He has a long history of smoking and I expect that he will need a biopsy to get a diagnosis.  -     TSH -     Vitamin B12 -     Folate -      Sedimentation Rate -     C-reactive protein -     POCT Skin KOH  Dermatitis due to drug reaction: He will return for a punch in 4 days if not improving as the rash and his tongue could be related. Pred only if rash is suddenly worsening.  -     clobetasol ointment (TEMOVATE) 0.05 %; Apply 1 application topically 2 (two) times daily. -     Hemoglobin A1c -     predniSONE (DELTASONE) 20 MG tablet; Fill only if rash is suddenly worsening. Take 3 in the morning for 3 days, then 2 in the morning for 3 days, and then 1 in the morning for 3 days.    The patient is advised to call or return to clinic if he does not see an improvement in symptoms, or to seek the care of the closest emergency department if he worsens with the above plan.   Deliah Boston, MHS, PA-C Primary Care at [facility] 12/20/2016 11:17 AM

## 2016-12-18 NOTE — Patient Instructions (Addendum)
Come back of Friday if the rash is not improving.  Come back sooner if it is worsening. I will contact you with the lab work.     IF you received an x-ray today, you will receive an invoice from [facility]. Please contact [facility] at [phone] with questions or concerns regarding your invoice.   IF you received labwork today, you will receive an invoice from [facility]. Please contact [facility] at [phone] with questions or concerns regarding your invoice.   Our billing staff will not be able to assist you with questions regarding bills from these companies.  You will be contacted with the lab results as soon as they are available. The fastest way to get your results is to activate your My Chart account. Instructions are located on the last page of this paperwork. If you have not heard from us regarding the results in 2 weeks, please contact this office.

## 2016-12-19 LAB — C-REACTIVE PROTEIN: CRP: 1.3 mg/L (ref 0.0–4.9)

## 2016-12-19 LAB — HEMOGLOBIN A1C
Est. average glucose Bld gHb Est-mCnc: 108 mg/dL
HEMOGLOBIN A1C: 5.4 % (ref 4.8–5.6)

## 2016-12-19 LAB — SEDIMENTATION RATE: Sed Rate: 7 mm/hr (ref 0–15)

## 2016-12-19 LAB — TSH: TSH: 1.36 u[IU]/mL (ref 0.450–4.500)

## 2016-12-19 LAB — FOLATE: Folate: 13.7 ng/mL

## 2016-12-19 LAB — VITAMIN B12: Vitamin B-12: 292 pg/mL (ref 232–1245)

## 2016-12-23 ENCOUNTER — Ambulatory Visit (INDEPENDENT_AMBULATORY_CARE_PROVIDER_SITE_OTHER): Payer: 59 | Admitting: Physician Assistant

## 2016-12-23 ENCOUNTER — Encounter: Payer: Self-pay | Admitting: Physician Assistant

## 2016-12-23 VITALS — BP 128/72 | HR 82 | Temp 98.5°F | Resp 16 | Ht 67.91 in | Wt 188.0 lb

## 2016-12-23 DIAGNOSIS — L270 Generalized skin eruption due to drugs and medicaments taken internally: Secondary | ICD-10-CM | POA: Diagnosis not present

## 2016-12-23 DIAGNOSIS — Q383 Other congenital malformations of tongue: Secondary | ICD-10-CM

## 2016-12-23 NOTE — Progress Notes (Signed)
    12/25/2016 8:10 AM   DOB: [birth year] / MRN: [id number]  SUBJECTIVE:  Dylan Edwards is a 46 y.o. male presenting for recheck of labs and rash about the neck.  The rash on his neck, though to be secondary to fluconazole, has resolved with clobetasol cream.  Historical tongue abnormality over the last year needs to be biopsied given normal work up.   He is allergic to diflucan [fluconazole].   He  has no past medical history on file.    He  reports that he quit smoking about 4 years ago. His smoking use included Cigarettes. He uses smokeless tobacco. He reports that he does not drink alcohol or use drugs. He  has no sexual activity history on file. The patient  has no past surgical history on file.  His family history includes Diabetes in his brother, mother, and sister.  Review of Systems  Constitutional: Negative for chills, diaphoresis and fever.  Gastrointestinal: Negative for nausea.  Skin: Negative for rash.  Neurological: Negative for dizziness.    The problem list and medications were reviewed and updated by myself where necessary and exist elsewhere in the encounter.   OBJECTIVE:  BP 128/72   Pulse 82   Temp 98.5 F (36.9 C) (Oral)   Resp 16   Ht 5' 7.91" (1.725 m)   Wt 188 lb (85.3 kg)   SpO2 100%   BMI 28.66 kg/m   Physical Exam  Constitutional: He appears well-developed. He is active.  Non-toxic appearance.  Cardiovascular: Normal rate.   Skin: Skin is warm and dry. He is not diaphoretic. No pallor.      No results found for this or any previous visit (from the past 72 hour(s)).  No results found.  ASSESSMENT AND PLAN:  Perrion was seen today for medication reaction.  Diagnoses and all orders for this visit:  Tongue abnormality: Advised that he proceed with biopsy.   Dermatitis due to drug reaction: Resolved.     The patient is advised to call or return to clinic if he does not see an improvement in symptoms, or to seek the care of the closest  emergency department if he worsens with the above plan.   Deliah Boston, MHS, PA-C Primary Care at [facility] 12/25/2016 8:10 AM

## 2016-12-23 NOTE — Patient Instructions (Addendum)
  Lab Results  Component Value Date   TSH 1.360 12/18/2016   Lab Results  Component Value Date   FOLATE 13.7 12/18/2016   Lab Results  Component Value Date   TSH 1.360 12/18/2016   Lab Results  Component Value Date   ESRSEDRATE 7 12/18/2016   Lab Results  Component Value Date   CRP 1.3 12/18/2016   Lab Results  Component Value Date   HGBA1C 5.4 12/18/2016   Tongue KOH and B12 both normal.      IF you received an x-ray today, you will receive an invoice from [facility]. Please contact [facility] at [phone] with questions or concerns regarding your invoice.   IF you received labwork today, you will receive an invoice from [place]. Please contact [facility] at [phone] with questions or concerns regarding your invoice.   Our billing staff will not be able to assist you with questions regarding bills from these companies.  You will be contacted with the lab results as soon as they are available. The fastest way to get your results is to activate your My Chart account. Instructions are located on the last page of this paperwork. If you have not heard from [place] regarding the results in 2 weeks, please contact this office.

## 2016-12-25 ENCOUNTER — Telehealth: Payer: Self-pay | Admitting: Family Medicine

## 2016-12-25 ENCOUNTER — Telehealth: Payer: Self-pay

## 2016-12-25 NOTE — Telephone Encounter (Signed)
Dylan Edwards PT CALLING ABOUT RESULTS I GAVE PT MESSAGE PT UNDERSTANDS RESULTS PLUS HE WOULD LIKE FOR Dylan Edwards TO PUT IN A REFERRAL TO AN ENTM

## 2016-12-25 NOTE — Telephone Encounter (Signed)
Call placed to patient to make him aware of provider notes. No answer on patient phone, left message for patient to return call to office./ S.Tandra Rosado,CMA

## 2016-12-25 NOTE — Telephone Encounter (Signed)
-----   Message from Ofilia Neas, PA-C sent at 12/20/2016  6:08 PM EDT ----- Please call him. This work up looks great.  I think he should proceed with biopsy at ENT or via his dentist. Deliah Boston, MS, PA-C 6:07 PM, 12/20/2016

## 2016-12-28 ENCOUNTER — Telehealth: Payer: Self-pay | Admitting: *Deleted

## 2016-12-28 DIAGNOSIS — Q383 Other congenital malformations of tongue: Secondary | ICD-10-CM

## 2016-12-28 NOTE — Telephone Encounter (Signed)
Done. Deliah Boston, MS, PA-C 5:33 PM, 12/28/2016 '

## 2016-12-28 NOTE — Telephone Encounter (Signed)
Pt would like a referral to ENT

## 2016-12-29 NOTE — Telephone Encounter (Signed)
I have placed the referral. Reece Agar.  Deliah Boston, MS, PA-C 8:33 AM, 12/29/2016

## 2017-01-01 NOTE — Telephone Encounter (Signed)
Referral sent to [facility] on 8/27

## 2017-03-02 ENCOUNTER — Other Ambulatory Visit: Payer: Self-pay | Admitting: Emergency Medicine

## 2017-03-07 ENCOUNTER — Telehealth: Payer: Self-pay

## 2017-03-07 NOTE — Telephone Encounter (Signed)
Pt needs follow up for Omeprazole prescription. Refilled x 30 days.  Sent to Scheduling for appt

## 2017-03-15 NOTE — Telephone Encounter (Signed)
error 

## 2017-10-31 ENCOUNTER — Encounter: Payer: Self-pay | Admitting: Physician Assistant

## 2017-10-31 ENCOUNTER — Other Ambulatory Visit: Payer: Self-pay

## 2017-10-31 ENCOUNTER — Ambulatory Visit (INDEPENDENT_AMBULATORY_CARE_PROVIDER_SITE_OTHER): Payer: 59 | Admitting: Physician Assistant

## 2017-10-31 VITALS — BP 130/82 | HR 68 | Temp 98.3°F | Resp 18 | Ht 67.5 in | Wt 188.6 lb

## 2017-10-31 DIAGNOSIS — Z1321 Encounter for screening for nutritional disorder: Secondary | ICD-10-CM | POA: Diagnosis not present

## 2017-10-31 DIAGNOSIS — Z1329 Encounter for screening for other suspected endocrine disorder: Secondary | ICD-10-CM | POA: Diagnosis not present

## 2017-10-31 DIAGNOSIS — Z23 Encounter for immunization: Secondary | ICD-10-CM

## 2017-10-31 DIAGNOSIS — Z13220 Encounter for screening for lipoid disorders: Secondary | ICD-10-CM

## 2017-10-31 DIAGNOSIS — Z125 Encounter for screening for malignant neoplasm of prostate: Secondary | ICD-10-CM

## 2017-10-31 DIAGNOSIS — Z130 Encounter for screening for diseases of the blood and blood-forming organs and certain disorders involving the immune mechanism: Secondary | ICD-10-CM

## 2017-10-31 DIAGNOSIS — Z114 Encounter for screening for human immunodeficiency virus [HIV]: Secondary | ICD-10-CM

## 2017-10-31 DIAGNOSIS — Z131 Encounter for screening for diabetes mellitus: Secondary | ICD-10-CM

## 2017-10-31 DIAGNOSIS — Z0000 Encounter for general adult medical examination without abnormal findings: Secondary | ICD-10-CM

## 2017-10-31 DIAGNOSIS — Z13228 Encounter for screening for other metabolic disorders: Secondary | ICD-10-CM

## 2017-10-31 NOTE — Progress Notes (Signed)
No family prostate, colon, lung cancer. LIkes to walk for exercise most days of the week. No family history of early stroke or MI.

## 2017-10-31 NOTE — Progress Notes (Signed)
10/31/2017 8:17 AM   DOB: 02/03/1971 / MRN: [id number]  SUBJECTIVE:  Dylan Edwards is a 47 y.o. male presenting for an annual physical and has no complaints today.  No family prostate, colon, lung cancer. LIkes to walk for exercise most days of the week. No family history of early stroke or MI. Takes no meds.    There is no immunization history on file for this patient.  Health Maintenance Due  Topic  . HIV Screening   . TETANUS/TDAP     He is allergic to diflucan [fluconazole].   He  has no past medical history on file.    He  reports that he quit smoking about 5 years ago. His smoking use included cigarettes. He uses smokeless tobacco. He reports that he does not drink alcohol or use drugs. He  has no sexual activity history on file. The patient  has no past surgical history on file.  His family history includes Diabetes in his brother, mother, and sister.  Review of Systems  Constitutional: Negative for chills, diaphoresis and fever.  Eyes: Negative.   Respiratory: Negative for cough, hemoptysis, sputum production, shortness of breath and wheezing.   Cardiovascular: Negative for chest pain, orthopnea and leg swelling.  Gastrointestinal: Negative for abdominal pain, blood in stool, constipation, diarrhea, heartburn, melena, nausea and vomiting.  Genitourinary: Negative for dysuria, flank pain, frequency, hematuria and urgency.  Skin: Negative for rash.  Neurological: Negative for dizziness, sensory change, speech change, focal weakness and headaches.    Problem list and medications reviewed and updated by myself where necessary, and exist elsewhere in the encounter.   OBJECTIVE:  BP 130/82 (BP Location: Right Arm, Patient Position: Sitting, Cuff Size: Large)   Pulse 68   Temp 98.3 F (36.8 C) (Oral)   Resp 18   Ht 5' 7.5" (1.715 m)   Wt 188 lb 9.6 oz (85.5 kg)   SpO2 99%   BMI 29.10 kg/m   Physical Exam  Constitutional: He is oriented to person, place, and  time. He appears well-developed. He does not appear ill.  Eyes: Pupils are equal, round, and reactive to light. Conjunctivae and EOM are normal.  Cardiovascular: Normal rate, regular rhythm, S1 normal, S2 normal, normal heart sounds, intact distal pulses and normal pulses. Exam reveals no gallop and no friction rub.  No murmur heard. Pulmonary/Chest: Effort normal. No stridor. No respiratory distress. He has no wheezes. He has no rales.  Abdominal: He exhibits no distension.  Musculoskeletal: Normal range of motion. He exhibits no edema.  Neurological: He is alert and oriented to person, place, and time. No cranial nerve deficit or sensory deficit. He exhibits normal muscle tone. Coordination normal.  Skin: Skin is warm and dry. No rash noted. He is not diaphoretic.  Psychiatric: He has a normal mood and affect.  Nursing note and vitals reviewed.   Lab Results  Component Value Date   WBC 6.6 06/21/2016   HGB 14.5 06/21/2016   HCT 41.4 06/21/2016   MCV 84 06/21/2016   PLT 333 06/21/2016    Lab Results  Component Value Date   NA 140 06/21/2016   K 4.3 06/21/2016   CL 97 06/21/2016   CO2 26 06/21/2016    Lab Results  Component Value Date   CREATININE 0.85 06/21/2016    Lab Results  Component Value Date   ALT 48 (H) 06/21/2016   AST 27 06/21/2016   ALKPHOS 65 06/21/2016   BILITOT 0.6 06/21/2016    Lab  Results  Component Value Date   TSH 1.360 12/18/2016    Lab Results  Component Value Date   HGBA1C 5.4 12/18/2016    ASSESSMENT AND PLAN  Jaicion was seen today for annual exam.  Diagnoses and all orders for this visit:  Annual physical exam  Screening for HIV (human immunodeficiency virus) -     HIV antibody  Screening for thyroid disorder -     TSH  Screening for endocrine, nutritional, metabolic and immunity disorder -     CBC -     Comprehensive metabolic panel  Screening for lipid disorders -     Lipid Panel  Screening PSA (prostate specific  antigen) -     PSA  Screening for diabetes mellitus -     Hemoglobin A1c    The patient was advised to call or return to clinic if he does not see an improvement in symptoms or to seek the care of the closest emergency department if he worsens with the above plan.   Deliah Boston, MHS, PA-C Primary [facility] at [facility] 10/31/2017 8:17 AM

## 2017-10-31 NOTE — Patient Instructions (Signed)
     IF you received an x-ray today, you will receive an invoice from [facility]. Please contact [facility] at [phone] with questions or concerns regarding your invoice.   IF you received labwork today, you will receive an invoice from [facility]. Please contact [facility] at [phone] with questions or concerns regarding your invoice.   Our billing staff will not be able to assist you with questions regarding bills from these companies.  You will be contacted with the lab results as soon as they are available. The fastest way to get your results is to activate your My Chart account. Instructions are located on the last page of this paperwork. If you have not heard from us regarding the results in 2 weeks, please contact this office.     

## 2017-11-01 LAB — PSA: PROSTATE SPECIFIC AG, SERUM: 0.8 ng/mL (ref 0.0–4.0)

## 2017-11-01 LAB — CBC
HEMOGLOBIN: 16.2 g/dL (ref 13.0–17.7)
Hematocrit: 47.9 % (ref 37.5–51.0)
MCH: 30.2 pg (ref 26.6–33.0)
MCHC: 33.8 g/dL (ref 31.5–35.7)
MCV: 89 fL (ref 79–97)
Platelets: 298 10*3/uL (ref 150–450)
RBC: 5.36 x10E6/uL (ref 4.14–5.80)
RDW: 13.1 % (ref 12.3–15.4)
WBC: 6.3 10*3/uL (ref 3.4–10.8)

## 2017-11-01 LAB — LIPID PANEL
Chol/HDL Ratio: 6.7 ratio — ABNORMAL HIGH (ref 0.0–5.0)
Cholesterol, Total: 208 mg/dL — ABNORMAL HIGH (ref 100–199)
HDL: 31 mg/dL — ABNORMAL LOW (ref >39)
LDL Calculated: 128 mg/dL — ABNORMAL HIGH (ref 0–99)
Triglycerides: 244 mg/dL — ABNORMAL HIGH (ref 0–149)
VLDL CHOLESTEROL CAL: 49 mg/dL — AB (ref 5–40)

## 2017-11-01 LAB — COMPREHENSIVE METABOLIC PANEL
A/G RATIO: 2.3 — AB (ref 1.2–2.2)
ALBUMIN: 4.6 g/dL (ref 3.5–5.5)
ALT: 18 IU/L (ref 0–44)
AST: 13 IU/L (ref 0–40)
Alkaline Phosphatase: 66 IU/L (ref 39–117)
BILIRUBIN TOTAL: 0.4 mg/dL (ref 0.0–1.2)
BUN / CREAT RATIO: 13 (ref 9–20)
BUN: 11 mg/dL (ref 6–24)
CHLORIDE: 104 mmol/L (ref 96–106)
CO2: 25 mmol/L (ref 20–29)
Calcium: 9.5 mg/dL (ref 8.7–10.2)
Creatinine, Ser: 0.82 mg/dL (ref 0.76–1.27)
GFR calc non Af Amer: 107 mL/min/{1.73_m2} (ref >59)
GFR, EST AFRICAN AMERICAN: 123 mL/min/{1.73_m2} (ref >59)
GLUCOSE: 109 mg/dL — AB (ref 65–99)
Globulin, Total: 2 g/dL (ref 1.5–4.5)
Potassium: 4.2 mmol/L (ref 3.5–5.2)
Sodium: 143 mmol/L (ref 134–144)
TOTAL PROTEIN: 6.6 g/dL (ref 6.0–8.5)

## 2017-11-01 LAB — TSH: TSH: 1.06 u[IU]/mL (ref 0.450–4.500)

## 2017-11-01 LAB — HIV ANTIBODY (ROUTINE TESTING W REFLEX): HIV Screen 4th Generation wRfx: NONREACTIVE

## 2017-11-01 LAB — HEMOGLOBIN A1C
Est. average glucose Bld gHb Est-mCnc: 111 mg/dL
HEMOGLOBIN A1C: 5.5 % (ref 4.8–5.6)

## 2020-12-30 ENCOUNTER — Emergency Department (HOSPITAL_COMMUNITY): Payer: Worker's Compensation

## 2020-12-30 ENCOUNTER — Encounter (HOSPITAL_COMMUNITY): Payer: Self-pay | Admitting: Emergency Medicine

## 2020-12-30 ENCOUNTER — Emergency Department (HOSPITAL_COMMUNITY)
Admission: EM | Admit: 2020-12-30 | Discharge: 2020-12-30 | Disposition: A | Discharge status: Home / Self Care | Payer: Worker's Compensation | Attending: Emergency Medicine | Admitting: Emergency Medicine

## 2020-12-30 DIAGNOSIS — S61412A Laceration without foreign body of left hand, initial encounter: Secondary | ICD-10-CM

## 2020-12-30 DIAGNOSIS — Z23 Encounter for immunization: Secondary | ICD-10-CM | POA: Diagnosis not present

## 2020-12-30 DIAGNOSIS — Y990 Civilian activity done for income or pay: Secondary | ICD-10-CM | POA: Insufficient documentation

## 2020-12-30 DIAGNOSIS — W268XXA Contact with other sharp object(s), not elsewhere classified, initial encounter: Secondary | ICD-10-CM | POA: Diagnosis not present

## 2020-12-30 DIAGNOSIS — Z87891 Personal history of nicotine dependence: Secondary | ICD-10-CM | POA: Diagnosis not present

## 2020-12-30 MED ORDER — LIDOCAINE-EPINEPHRINE 1 %-1:100000 IJ SOLN
20.0000 mL | Freq: Once | INTRAMUSCULAR | Status: AC
  Administered 2020-12-30: 20 mL
  Filled 2020-12-30: qty 1

## 2020-12-30 MED ORDER — BACITRACIN ZINC 500 UNIT/GM EX OINT
TOPICAL_OINTMENT | Freq: Once | CUTANEOUS | Status: AC
  Administered 2020-12-30: 1 via TOPICAL
  Filled 2020-12-30: qty 0.9

## 2020-12-30 MED ORDER — LIDOCAINE-EPINEPHRINE (PF) 2 %-1:200000 IJ SOLN: 10.0000 mL | Freq: Once | INTRAMUSCULAR | Status: DC

## 2020-12-30 MED ORDER — TETANUS-DIPHTH-ACELL PERTUSSIS 5-2.5-18.5 LF-MCG/0.5 IM SUSY
0.5000 mL | PREFILLED_SYRINGE | Freq: Once | INTRAMUSCULAR | Status: AC
  Administered 2020-12-30: 0.5 mL via INTRAMUSCULAR
  Filled 2020-12-30: qty 0.5

## 2020-12-30 MED ORDER — OXYCODONE HCL 5 MG PO TABS
5.0000 mg | ORAL_TABLET | Freq: Once | ORAL | Status: AC
  Administered 2020-12-30: 5 mg via ORAL
  Filled 2020-12-30: qty 1

## 2020-12-30 MED ORDER — BACITRACIN ZINC 500 UNIT/GM EX OINT: TOPICAL_OINTMENT | Freq: Two times a day (BID) | CUTANEOUS | Status: DC

## 2020-12-30 NOTE — Discharge Instructions (Addendum)
Please keep the sutures in for 10 to 14 days.  After that, you can have the removed by your family doctor or an urgent care provider.  Please do not get your hand wet for the next 2 days.  After that, you can run soapy water over the wound but do not soak it or take a bath for 7 days.

## 2020-12-30 NOTE — ED Notes (Signed)
Left hand wound cleansed and dressed with soft gauze and kerlex to cover, tolerated well.

## 2020-12-30 NOTE — ED Provider Notes (Signed)
Emergency Medicine Provider Triage Evaluation Note  Dylan Edwards , a 50 y.o. male  was evaluated in triage.  Pt complains of hand laceration.  Was removing a broken toilet when a piece broke off and cut the dorsum of his left hand.  Patient is right-hand dominant.  Bleeding controlled with dressing, no numbness weakness or tingling.  Unsure of last tetanus vaccination  Review of Systems  Positive: laceration Negative: Numbness, weakness  Physical Exam  BP (!) 141/86 (BP Location: Right Arm)   Pulse 72   Temp 99.2 F (37.3 C) (Oral)   Resp 14   SpO2 99%  Gen:   Awake, no distress   Resp:  Normal effort  MSK:   Moves extremities without difficulty  Other:  Laceration to dorsum of left hand, dressing in place  Medical Decision Making  Medically screening exam initiated at 12:00 PM.  Appropriate orders placed.  Desiree Fleming was informed that the remainder of the evaluation will be completed by another provider, this initial triage assessment does not replace that evaluation, and the importance of remaining in the ED until their evaluation is complete.     Dartha Lodge, PA-C 12/30/20 1203    Pricilla Loveless, MD 12/30/20 [phone]

## 2020-12-30 NOTE — ED Provider Notes (Signed)
[facility] EMERGENCY DEPARTMENT Provider Note   CSN: [id number] Arrival date & time: 12/30/20  1133     History Chief Complaint  Patient presents with   Extremity Laceration    Dylan Edwards is a 50 y.o. male.  HPI  50 year old male with no pertinent past medical history presenting to the emergency department with a left hand laceration.  Patient reports that he is right-handed.  He states that they were moving a porcelain toilet today when he pushed it off of his friend who is struggling to carry it.  When it broke, a piece of the porcelain shot up and hit the back of his left hand, causing a laceration.  He reports left hand pain, sharp, constant, nonradiating.  It is worse if he attempts to move his middle finger.  No therapies tried at work prior to arrival.  He was at work when this incident happened.  History reviewed. No pertinent past medical history.  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     Family History  Problem Relation Age of Onset   Diabetes Mother    Diabetes Sister    Diabetes Brother     Social History   Tobacco Use   Smoking status: Former    Packs/day: 1.00    Years: 20.00    Pack years: 20.00    Types: Cigarettes    Quit date: 02/07/2012    Years since quitting: 8.9   Smokeless tobacco: Current  Substance Use Topics   Alcohol use: No    Alcohol/week: 0.0 standard drinks   Drug use: No    Home Medications Prior to Admission medications   Not on File    Allergies    Diflucan [fluconazole]  Review of Systems   Review of Systems  Constitutional:  Negative for chills and fever.  HENT:  Negative for ear pain and sore throat.   Eyes:  Negative for pain and visual disturbance.  Respiratory:  Negative for cough and shortness of breath.   Cardiovascular:  Negative for chest pain and palpitations.  Gastrointestinal:  Negative  for abdominal pain and vomiting.  Genitourinary:  Negative for dysuria and hematuria.  Musculoskeletal:  Negative for arthralgias and back pain.  Skin:  Positive for wound. Negative for color change and rash.  Neurological:  Negative for seizures and syncope.  All other systems reviewed and are negative.  Physical Exam Updated Vital Signs BP 138/89 (BP Location: Right Arm)   Pulse 63   Temp 99.2 F (37.3 C) (Oral)   Resp 20   SpO2 98%   Physical Exam Vitals and nursing note reviewed.  Constitutional:      Appearance: He is well-developed.  HENT:     Head: Normocephalic and atraumatic.  Eyes:     Conjunctiva/sclera: Conjunctivae normal.  Cardiovascular:     Rate and Rhythm: Normal rate and regular rhythm.     Heart sounds: No murmur heard. Pulmonary:     Effort: Pulmonary effort is normal. No respiratory distress.     Breath sounds: Normal breath sounds.  Abdominal:     Palpations: Abdomen is soft.     Tenderness: There is no abdominal tenderness.  Musculoskeletal:        General: Signs of injury present.     Cervical back: Neck supple.     Comments: 4 cm linear laceration to the posterior aspect of  the left hand.  Proximal to the MCP of the third digit in an oblique orientation.  No ligament or bone visible in the wound.  Flexion extension intact in the second and third MCP, PIP, and DIP.  There is generalized tenderness to the dorsal aspect of the hand but no focal tenderness over the MCP itself.  No anatomic snuffbox tenderness.  2+ radial pulse.  Skin:    General: Skin is warm and dry.     Capillary Refill: Capillary refill takes less than 2 seconds.  Neurological:     General: No focal deficit present.     Mental Status: He is alert and oriented to person, place, and time.    ED Results / Procedures / Treatments   Labs (all labs ordered are listed, but only abnormal results are displayed) Labs Reviewed - No data to display  EKG None  Radiology DG Hand  Complete Left  Result Date: 12/30/2020 CLINICAL DATA:  Hand injury.  Laceration. EXAM: LEFT HAND - COMPLETE 3+ VIEW COMPARISON:  None. FINDINGS: Negative for fracture or dislocation.  No arthropathy Laceration overlying the fifth MCP region with overlying bandage. No foreign body. IMPRESSION: Negative for fracture or foreign body.  Laceration. Electronically Signed   By: Marlan Palau M.D.   On: 12/30/2020 12:28    Procedures .Marland KitchenLaceration Repair  Date/Time: 12/30/2020 4:10 PM Performed by: Lenard Lance, MD Authorized by: Cathren Laine, MD   Consent:    Consent obtained:  Verbal   Consent given by:  Patient   Risks discussed:  Infection, poor cosmetic result, poor wound healing, need for additional repair, vascular damage, tendon damage, pain and retained foreign body Universal protocol:    Patient identity confirmed:  Verbally with patient Anesthesia:    Anesthesia method:  Local infiltration   Local anesthetic:  Lidocaine 2% WITH epi Laceration details:    Location:  Hand   Hand location:  L hand, dorsum   Length (cm):  4   Depth (mm):  1 Pre-procedure details:    Preparation:  Patient was prepped and draped in usual sterile fashion and imaging obtained to evaluate for foreign bodies Exploration:    Imaging obtained: x-ray     Imaging outcome: foreign body not noted     Wound exploration: wound explored through full range of motion and entire depth of wound visualized     Wound extent: no tendon damage noted and no underlying fracture noted     Contaminated: no   Treatment:    Area cleansed with:  Povidone-iodine   Amount of cleaning:  Standard   Irrigation solution:  Tap water and sterile water   Irrigation volume:  300   Irrigation method:  Syringe   Visualized foreign bodies/material removed: no     Debridement:  None Skin repair:    Repair method:  Sutures   Suture size:  4-0   Wound skin closure material used: Ethilon.   Suture technique:  Simple interrupted    Number of sutures:  5 Approximation:    Approximation:  Close Repair type:    Repair type:  Simple Post-procedure details:    Procedure completion:  Tolerated well, no immediate complications Comments:     There is some loose tissue over the distal aspect of the wound margin that has\d a tendency to invert, but eversion was maintained at the end of the procedure.   Medications Ordered in ED Medications  Tdap (BOOSTRIX) injection 0.5 mL (has no administration in time range)  lidocaine-EPINEPHrine (XYLOCAINE W/EPI) 1 %-1:100000 (with pres) injection 20 mL (has no administration in time range)  oxyCODONE (Oxy IR/ROXICODONE) immediate release tablet 5 mg (has no administration in time range)  bacitracin ointment (has no administration in time range)    ED Course  I have reviewed the triage vital signs and the nursing notes.  Pertinent labs & imaging results that were available during my care of the patient were reviewed by me and considered in my medical decision making (see chart for details).    MDM Rules/Calculators/A&P                           50 year old right-handed male with a left hand laceration.  He sustained this at work from porcelain.  Imaging obtained, no retained foreign body or underlying fracture noted.  On physical exam, he has a simple superficial laceration to the dorsal aspect of the hand.  No evidence of tendon or bony injury on exam.  He is neurovascularly intact distal to this injury.  Repair undertaken at bedside as detailed above.  We will update his tetanus.  Will provide analgesia.  After that, I believe the patient will be stable for discharge from the emergency department with routine laceration wound care which I discussed with the patient and routine suture removal.  Patient is comfortable that plan.  Final Clinical Impression(s) / ED Diagnoses Final diagnoses:  Laceration of left hand without foreign body, initial encounter   Rx / DC Orders ED  Discharge Orders     None        Lenard Lance, MD 12/30/20 1614    Cathren Laine, MD 12/30/20 [phone]

## 2020-12-30 NOTE — ED Triage Notes (Addendum)
Patient here with approximately 3 laceration on on posterior left hand that occurred when he was throwing porcelain toilet tank into a dumpster and part of his shattered, bounced off the wall, and cut his hand. Hemorrhage controlled. Last tetanus several years ago. Patient able to move fingers and sensation intact distal to injury.

## 2022-11-05 IMAGING — DX DG HAND COMPLETE 3+V*L*
3 series · 3 of 3 positions shown · non-contrast
Comparison: None.

CLINICAL DATA: Hand injury.  Laceration.

EXAM:
LEFT HAND - COMPLETE 3+ VIEW

[hand pa]
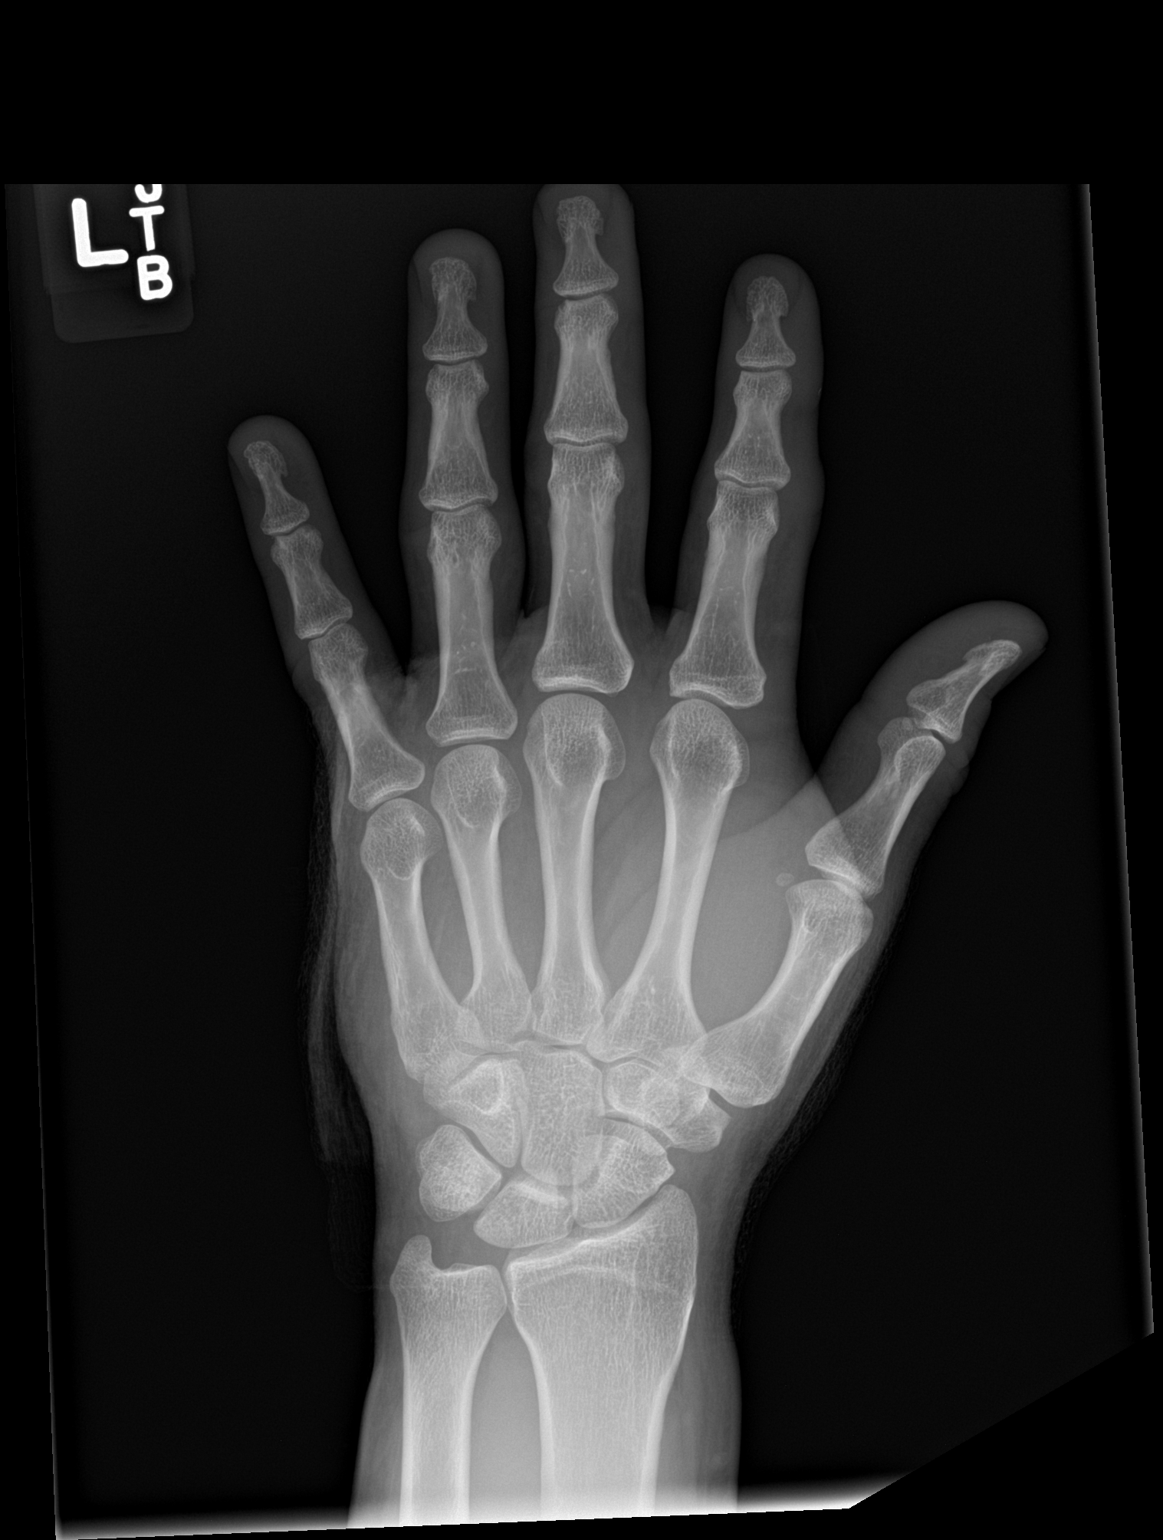

[hand obl]
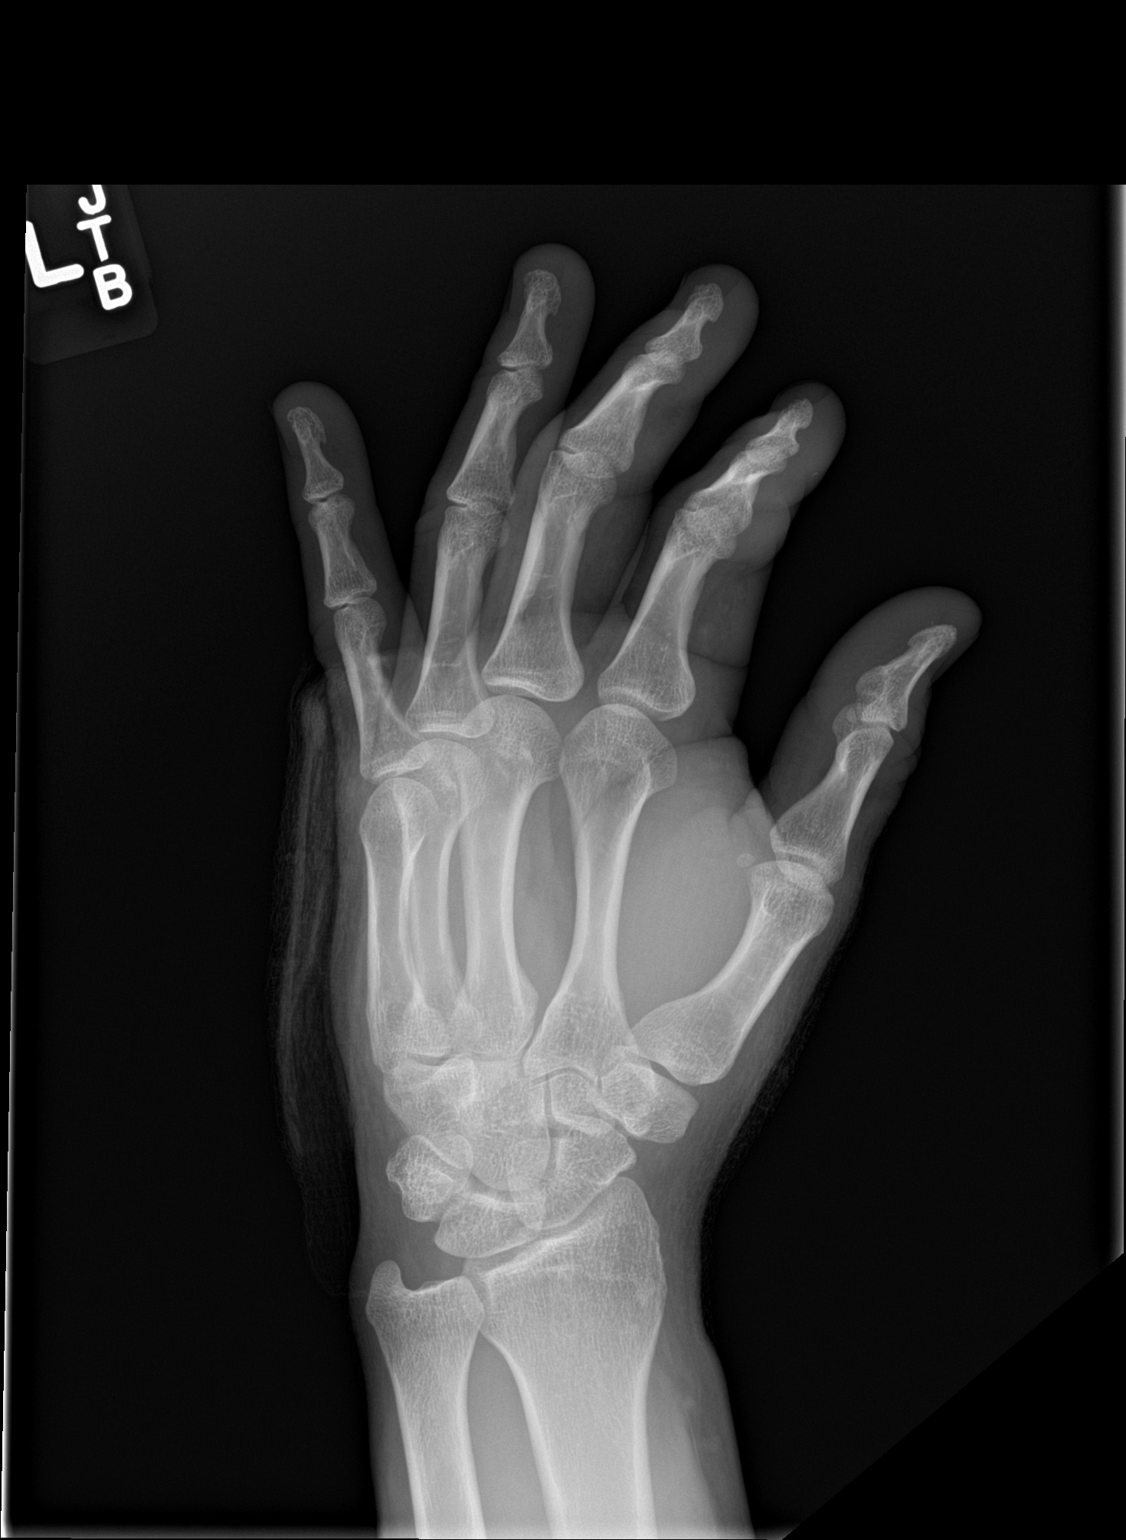

[hand lat]
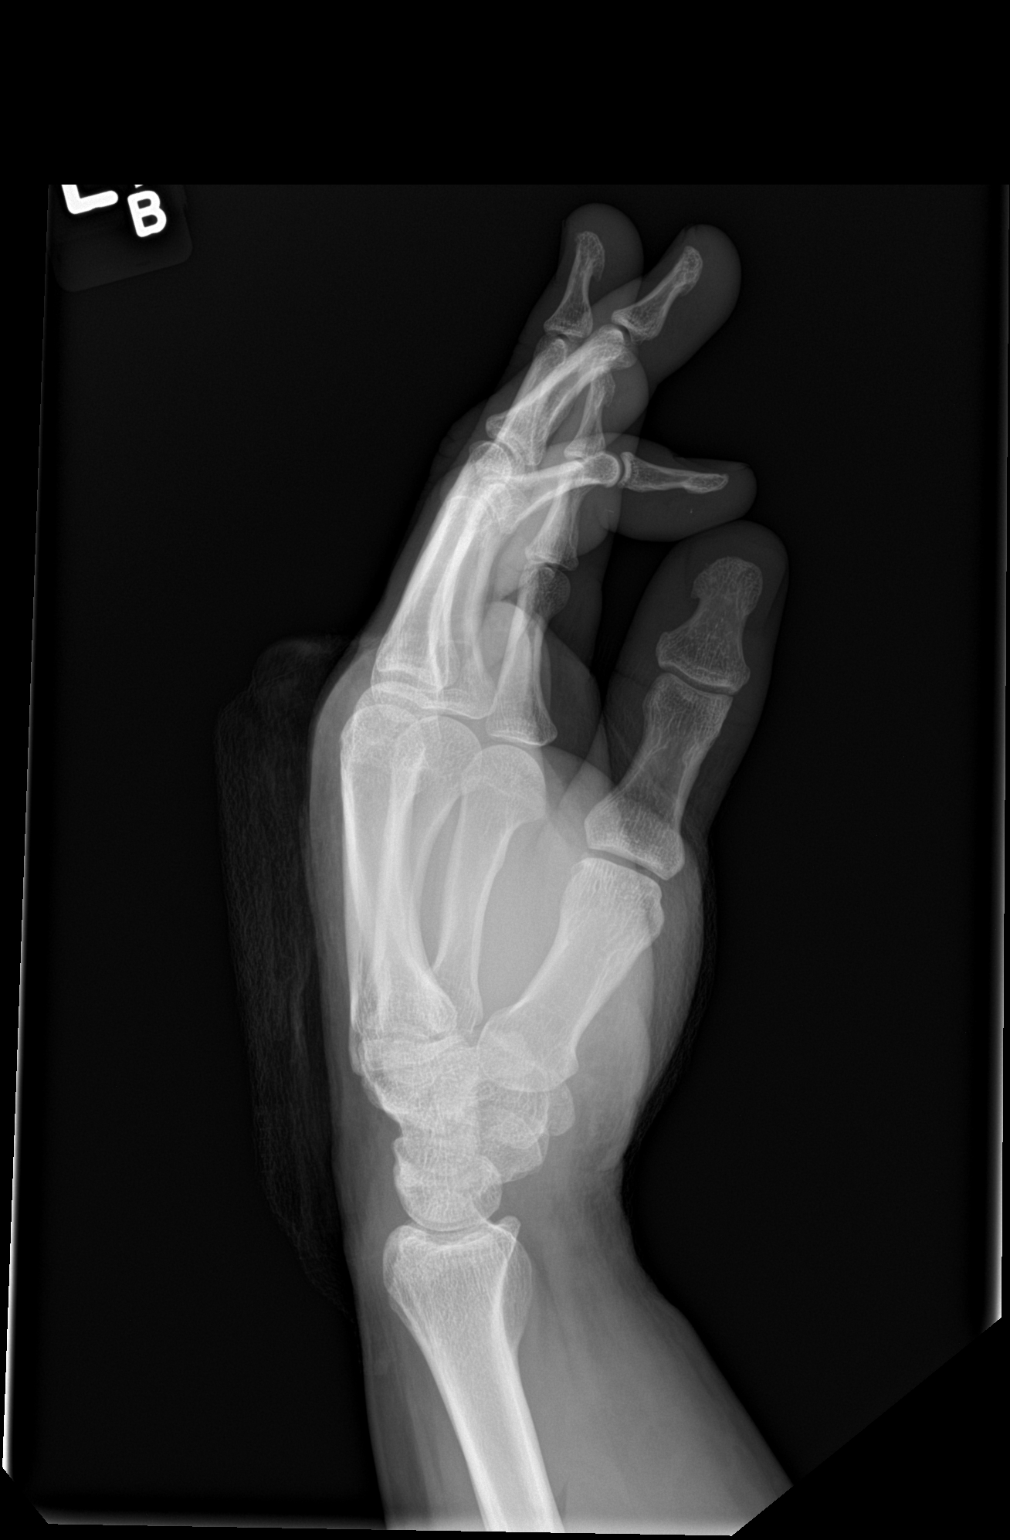

[3 of 3 positions shown; findings below may reference images not displayed]

FINDINGS: Negative for fracture or dislocation.  No arthropathy

Laceration overlying the fifth MCP region with overlying bandage. No
foreign body.
IMPRESSION: Negative for fracture or foreign body.  Laceration.

## 2023-05-09 HISTORY — PX: KNEE SURGERY: SHX244

## 2023-11-29 ENCOUNTER — Emergency Department (HOSPITAL_BASED_OUTPATIENT_CLINIC_OR_DEPARTMENT_OTHER)
Admission: EM | Admit: 2023-11-29 | Discharge: 2023-11-29 | Disposition: A | Discharge status: Home / Self Care | Attending: Emergency Medicine | Admitting: Emergency Medicine

## 2023-11-29 ENCOUNTER — Other Ambulatory Visit: Payer: Self-pay

## 2023-11-29 DIAGNOSIS — K649 Unspecified hemorrhoids: Secondary | ICD-10-CM | POA: Diagnosis present

## 2023-11-29 DIAGNOSIS — K644 Residual hemorrhoidal skin tags: Secondary | ICD-10-CM | POA: Diagnosis not present

## 2023-11-29 MED ORDER — HYDROCORTISONE (PERIANAL) 2.5 % EX CREA: 1.0000 | TOPICAL_CREAM | Freq: Two times a day (BID) | CUTANEOUS | 0 refills | Status: AC

## 2023-11-29 NOTE — ED Provider Notes (Signed)
 Dylan Edwards Provider Note   Dylan Edwards Arrival date & time: 11/29/23  2105     Patient presents with: Hemorrhoids   Dylan Edwards is a 53 y.o. male.   53 year old male presenting with hemorrhoids.  Patient had recent left knee injury and was on opioids for pain, this resulted in constipation and for about 3 weeks he has had off-and-on pain which he attributes to hemorrhoids.  He has been taking stool softeners and using Preparation H with minimal relief of his symptoms, his friend was able to prescribe him a topical agent that he has been using as well with minimal relief, he cannot remove the name of this medication.  He reports pain specifically after bowel movements, no fevers at home.  He reports that his doctor friend told him to come to the emergency department to get a general surgery referral in order to evaluate this issue.        Prior to Admission medications   Not on File    Allergies: Diflucan [fluconazole]    Review of Systems  Updated Vital Signs BP (!) 149/88 (BP Location: Right Arm)   Pulse (!) 101   Temp 98.1 F (36.7 C) (Oral)   Resp 16   Ht 5' 7.5 (1.715 m)   Wt 86 kg   SpO2 98%   BMI 29.26 kg/m   Physical Exam Vitals and nursing note reviewed.  HENT:     Head: Normocephalic.  Eyes:     Extraocular Movements: Extraocular movements intact.     Pupils: Pupils are equal, round, and reactive to light.  Cardiovascular:     Rate and Rhythm: Normal rate.  Pulmonary:     Effort: Pulmonary effort is normal.  Genitourinary:    Comments: Rectal exam performed with nurse at the bedside as chaperone  2 external hemorrhoids noted, no evidence of thrombosis or bleeding, area is minimally tender to palpation, no perianal fluctuance/erythema/warmth. No obvious fissure or lesion to anus. Musculoskeletal:     Comments: Moves all extremities spontaneously without difficulty  Skin:    General:  Skin is warm and dry.  Neurological:     Mental Status: He is alert and oriented to person, place, and time.     (all labs ordered are listed, but only abnormal results are displayed) Labs Reviewed - No data to display  EKG: None  Radiology: No results found.   Procedures   Medications Ordered in the ED - No data to display                                  Medical Decision Making This patient presents to the ED for concern of hemorrhoids, this involves an extensive number of treatment options, and is a complaint that carries with it a high risk of complications and morbidity.  The differential diagnosis includes external hemorrhoid, internal hemorrhoid, thrombosed hemorrhoid, anal fissure.   Co morbidities that complicate the patient evaluation  History of hemorrhoids   Additional history obtained:  Additional history obtained from record review External records from outside source obtained and reviewed including recent urgent care note  Problem List / ED Course / Critical interventions / Medication management I have reviewed the patients home medicines and have made adjustments as needed   Test / Admission - Considered:  Physical exam notable as above, there are at least 2 external hemorrhoids noted on exam  however they are not thrombosed, no active bleeding, no anal fissure visualized, no evidence of perianal abscess, I did not feel that labs/imaging were warranted at this time given these reassuring findings.  Advised patient to continue with therapeutic measures including sitz bath, fiber, increase fluid intake, Tuck's pads, and will prescribe Anusol  to be used rectally.  Continue Tylenol /ibuprofen  as needed for pain, patient understands that opioids will exacerbate his constipation and worsen his symptoms.  Will provide the patient with the contact information for a general surgeon in order to further evaluate this issue.  He voiced understanding and is in agreement  with this plan, strict return precautions discussed.  He is approved for discharge at this time.    Risk Prescription drug management.        Final diagnoses:  External hemorrhoids    ED Discharge Orders          Ordered    hydrocortisone  (ANUSOL -HC) 2.5 % rectal cream  2 times daily        11/29/23 2329               Dylan Edwards, Dylan Edwards 11/29/23 2340    Dylan Ozell LABOR, Dylan Edwards 12/02/23 1828

## 2023-11-29 NOTE — Discharge Instructions (Addendum)
 Continue high-fiber diet, increased fluid intake, and stool softeners for hemorrhoids.  Continue sitz bath's for comfort.  Continue Tylenol /ibuprofen  as needed for pain relief.  Start Anusol , apply rectally twice daily.  You can use Tucks medicated pads, apply to the affected area as needed for pain, these can be found over-the-counter.  I have provided you with the contact information for the general surgery service, please call their office tomorrow to schedule follow-up in regard to your symptoms.  Turn to the emergency department if your symptoms worsen.

## 2023-11-29 NOTE — ED Notes (Signed)
 Pt has been on pain meds which has caused constipation and now hemorrhoids.  Has been using OTC meds without relief.

## 2023-11-29 NOTE — ED Triage Notes (Signed)
 Pt states he hurt his left knee a few weeks ago, was put on pain medication that led to constipation which led to the hemorrhoids. Pt states he has tried medications OTC & prescription but after a bowel movements he can't take the pain. States he feels something sticking out & anything touching it makes it worse. States he is not able to work or do anything due to the pain. Was told he by his friend that is a doctor if it has been 3 weeks he needed to see surgeon but can't be seen without a referral.  Pt states he takes stool softener daily.

## 2024-03-13 ENCOUNTER — Encounter: Payer: Self-pay | Admitting: Internal Medicine

## 2024-03-27 ENCOUNTER — Encounter: Payer: Self-pay | Admitting: Internal Medicine

## 2024-04-15 ENCOUNTER — Encounter: Payer: Self-pay | Admitting: Physician Assistant

## 2024-05-22 NOTE — Progress Notes (Unsigned)
 "  Chief Complaint: Hemorrhoids and discuss colonoscopy  HPI:    Dylan Edwards is a 54 year old male with a past medical history as listed below, who presents to clinic to discuss hemorrhoids and a possible colonoscopy.    2024 Cologuard negative.    12/17/2023 patient seen by [facility] for anal fissure.  At time of that exam normal perianal skin, beginnings of a posterior midline anal fissure with punctate opening at the base.  At that time recommended to take fiber, apply topical Diltiazem 4 times daily.  Referred to GI.  No past medical history on file.  No past surgical history on file.  Current Outpatient Medications  Medication Sig Dispense Refill   hydrocortisone  (ANUSOL -HC) 2.5 % rectal cream Place 1 Application rectally 2 (two) times daily. 30 g 0   No current facility-administered medications for this visit.    Allergies as of 05/23/2024 - Review Complete 11/29/2023  Allergen Reaction Noted   Diflucan [fluconazole] Rash 12/23/2016    Family History  Problem Relation Age of Onset   Diabetes Mother    Diabetes Sister    Diabetes Brother     Social History   Socioeconomic History   Marital status: Unknown    Spouse name: Not on file   Number of children: Not on file   Years of education: Not on file   Highest education level: Not on file  Occupational History   Not on file  Tobacco Use   Smoking status: Former    Current packs/day: 0.00    Average packs/day: 1 pack/day for 20.0 years (20.0 ttl pk-yrs)    Types: Cigarettes    Start date: 02/07/1992    Quit date: 02/07/2012    Years since quitting: 12.2   Smokeless tobacco: Current  Substance and Sexual Activity   Alcohol use: No    Alcohol/week: 0.0 standard drinks of alcohol   Drug use: No   Sexual activity: Not on file  Other Topics Concern   Not on file  Social History Narrative   Not on file   Social Drivers of Health   Tobacco Use: Low Risk (05/17/2024)   Received from [facility]   Patient  History    Smoking Tobacco Use: Never    Smokeless Tobacco Use: Never    Passive Exposure: Not on file  Financial Resource Strain: Medium Risk (04/09/2024)   Received from [facility]   Overall Financial Resource Strain (CARDIA)    How hard is it for you to pay for the very basics like food, housing, medical care, and heating?: Somewhat hard  Food Insecurity: No Food Insecurity (04/09/2024)   Received from [facility]   Epic    Within the past 12 months, you worried that your food would run out before you got the money to buy more.: Never true    Within the past 12 months, the food you bought just didn't last and you didn't have money to get more.: Never true  Transportation Needs: No Transportation Needs (04/09/2024)   Received from [facility]    In the past 12 months, has lack of transportation kept you from medical appointments or from getting medications?: No    In the past 12 months, has lack of transportation kept you from meetings, work, or from getting things needed for daily living?: No  Physical Activity: Insufficiently Active (04/09/2024)   Received from [facility]   Exercise Vital Sign    On average, how many days per  week do you engage in moderate to strenuous exercise (like a brisk walk)?: 4 days    On average, how many minutes do you engage in exercise at this level?: 30 min  Stress: No Stress Concern Present (04/09/2024)   Received from [facility] - Occupational Stress Questionnaire    Do you feel stress - tense, restless, nervous, or anxious, or unable to sleep at night because your mind is troubled all the time - these days?: Only a little  Social Connections: Socially Integrated (04/09/2024)   Received from [facility]   Social Network    How would you rate your social network (family, work, friends)?: Good participation with social networks  Intimate Partner Violence: Not At Risk (04/09/2024)   Received from  [facility]   HITS    Over the last 12 months how often did your partner physically hurt you?: Never    Over the last 12 months how often did your partner insult you or talk down to you?: Never    Over the last 12 months how often did your partner threaten you with physical harm?: Never    Over the last 12 months how often did your partner scream or curse at you?: Never  Depression (PHQ2-9): Not on file  Alcohol Screen: Not on file  Housing: High Risk (04/09/2024)   Received from [facility]    In the last 12 months, was there a time when you were not able to pay the mortgage or rent on time?: Yes    In the past 12 months, how many times have you moved where you were living?: 0    At any time in the past 12 months, were you homeless or living in a shelter (including now)?: No  Utilities: Not At Risk (04/09/2024)   Received from [facility]    In the past 12 months has the electric, gas, oil, or water company threatened to shut off services in your home?: No  Health Literacy: Not on file    Review of Systems:    Constitutional: No weight loss, fever, chills, weakness or fatigue HEENT: Eyes: No change in vision               Ears, Nose, Throat:  No change in hearing or congestion Skin: No rash or itching Cardiovascular: No chest pain, chest pressure or palpitations   Respiratory: No SOB or cough Gastrointestinal: See HPI and otherwise negative Genitourinary: No dysuria or change in urinary frequency Neurological: No headache, dizziness or syncope Musculoskeletal: No new muscle or joint pain Hematologic: No bleeding or bruising Psychiatric: No history of depression or anxiety    Physical Exam:  Vital signs: There were no vitals taken for this visit.  Constitutional:   Pleasant Caucasian male appears to be in NAD, Well developed, Well nourished, alert and cooperative Head:  Normocephalic and atraumatic. Eyes:   PEERL, EOMI. No icterus. Conjunctiva  pink. Ears:  Normal auditory acuity. Neck:  Supple Throat: Oral cavity and pharynx without inflammation, swelling or lesion.  Respiratory: Respirations even and unlabored. Lungs clear to auscultation bilaterally.   No wheezes, crackles, or rhonchi.  Cardiovascular: Normal S1, S2. No MRG. Regular rate and rhythm. No peripheral edema, cyanosis or pallor.  Gastrointestinal:  Soft, nondistended, nontender. No rebound or guarding. Normal bowel sounds. No appreciable masses or hepatomegaly. Rectal:  Not performed.  Msk:  Symmetrical without gross deformities. Without edema, no deformity  or joint abnormality.  Neurologic:  Alert and  oriented x4;  grossly normal neurologically.  Skin:   Dry and intact without significant lesions or rashes. Psychiatric: Oriented to person, place and time. Demonstrates good judgement and reason without abnormal affect or behaviors.  RELEVANT LABS AND IMAGING: CBC    Component Value Date/Time   WBC 6.3 10/31/2017 0848   WBC 4.5 (A) 01/05/2015 1211   RBC 5.36 10/31/2017 0848   RBC 5.19 01/05/2015 1211   HGB 16.2 10/31/2017 0848   HCT 47.9 10/31/2017 0848   PLT 298 10/31/2017 0848   MCV 89 10/31/2017 0848   MCH 30.2 10/31/2017 0848   MCH 28.7 01/05/2015 1211   MCHC 33.8 10/31/2017 0848   MCHC 32.7 01/05/2015 1211   RDW 13.1 10/31/2017 0848   LYMPHSABS 3.0 06/21/2016 1519   EOSABS 0.1 06/21/2016 1519   BASOSABS 0.1 06/21/2016 1519    CMP     Component Value Date/Time   NA 143 10/31/2017 0848   K 4.2 10/31/2017 0848   CL 104 10/31/2017 0848   CO2 25 10/31/2017 0848   GLUCOSE 109 (H) 10/31/2017 0848   GLUCOSE 96 01/05/2015 1205   BUN 11 10/31/2017 0848   CREATININE 0.82 10/31/2017 0848   CREATININE 0.85 01/05/2015 1205   CALCIUM 9.5 10/31/2017 0848   PROT 6.6 10/31/2017 0848   ALBUMIN 4.6 10/31/2017 0848   AST 13 10/31/2017 0848   ALT 18 10/31/2017 0848   ALKPHOS 66 10/31/2017 0848   BILITOT 0.4 10/31/2017 0848   GFRNONAA 107 10/31/2017 0848    GFRNONAA >89 01/05/2015 1205   GFRAA 123 10/31/2017 0848   GFRAA >89 01/05/2015 1205    Assessment: 1. ***  Plan: 1. ***     Dylan Failing, PA-C [facility] 05/22/2024, 4:17 PM  Cc: No ref. provider found  "

## 2024-05-23 ENCOUNTER — Encounter: Payer: Self-pay | Admitting: Physician Assistant

## 2024-05-23 ENCOUNTER — Ambulatory Visit: Admitting: Physician Assistant

## 2024-05-23 VITALS — BP 150/78 | HR 63 | Ht 67.5 in | Wt 207.4 lb

## 2024-05-23 DIAGNOSIS — K625 Hemorrhage of anus and rectum: Secondary | ICD-10-CM | POA: Diagnosis not present

## 2024-05-23 DIAGNOSIS — K5900 Constipation, unspecified: Secondary | ICD-10-CM

## 2024-05-23 DIAGNOSIS — K602 Anal fissure, unspecified: Secondary | ICD-10-CM | POA: Diagnosis not present

## 2024-05-23 DIAGNOSIS — K5909 Other constipation: Secondary | ICD-10-CM | POA: Diagnosis not present

## 2024-05-23 MED ORDER — AMBULATORY NON FORMULARY MEDICATION: 1 refills | Status: AC

## 2024-05-23 MED ORDER — NA SULFATE-K SULFATE-MG SULF 17.5-3.13-1.6 GM/177ML PO SOLN: 1.0000 | Freq: Once | ORAL | 0 refills | Status: AC

## 2024-05-23 NOTE — Patient Instructions (Addendum)
 We have sent a prescription for Diltiazem 2% gel to [facility] for you. Using your index finger, you should apply a small amount of medication inside the rectum up to your first knuckle/joint three times daily x 6-8 weeks.  [facility]'s information is below: Address: [address] [place], [place] [address]  [phone]  *Please DO NOT go directly from our office to pick up this medication! Give the pharmacy 1 day to process the prescription as this is compounded and takes time to make.  Use Reticare w/Lidocaine  as needed.   Continue Miralax and stool softener as needed.   You have been scheduled for a colonoscopy. Please follow written instructions given to you at your visit today.   If you use inhalers (even only as needed), please bring them with you on the day of your procedure.  DO NOT TAKE 7 DAYS PRIOR TO TEST- Trulicity (dulaglutide) Ozempic, Wegovy (semaglutide) Mounjaro, Zepbound (tirzepatide) Bydureon Bcise (exanatide extended release)  DO NOT TAKE 1 DAY PRIOR TO YOUR TEST Rybelsus (semaglutide) Adlyxin (lixisenatide) Victoza (liraglutide) Byetta (exanatide) ___________________________________________________________________________

## 2024-05-28 NOTE — Progress Notes (Signed)
 ____________________________________________________________  Attending physician addendum:  Thank you for sending this case to me. I have reviewed the entire note and agree with the plan.  Surgical consult notes concerns regarding the possibility of IBD and this nonhealing fissure are understandable, though I think that is doubtful given its posterior location.  Colonoscopy certainly reasonable, though I anticipate the patient will have a difficult time with the bowel preparation due to fissure pain.  And while we certainly try to relieve the underlying constipation to give the fissure best chance to heal, that is likely to be challenging in this case because of the chronicity and severity of the fissure is in turn worsening the constipation creating a vicious cycle. If his colonoscopy rules out IBD and the fissure does not heal by then, I will send him back to Dr. Teresa at [facility] for consideration of anal sphincter Botox therapy.  Victory Brand, MD  ____________________________________________________________

## 2024-06-10 ENCOUNTER — Encounter: Payer: Self-pay | Admitting: Gastroenterology

## 2024-06-17 ENCOUNTER — Encounter: Admitting: Gastroenterology
